# Patient Record
Sex: Male | Born: 1960 | Race: Black or African American | Hispanic: No | Marital: Married | Smoking: Former smoker
Health system: Southern US, Community
[De-identification: ages and names within clinical notes are randomized; demographics above are authoritative.]

## PROBLEM LIST (undated history)

## (undated) DIAGNOSIS — I214 Non-ST elevation (NSTEMI) myocardial infarction: Secondary | ICD-10-CM

## (undated) DIAGNOSIS — I10 Essential (primary) hypertension: Secondary | ICD-10-CM

## (undated) DIAGNOSIS — I2510 Atherosclerotic heart disease of native coronary artery without angina pectoris: Secondary | ICD-10-CM

## (undated) DIAGNOSIS — E785 Hyperlipidemia, unspecified: Secondary | ICD-10-CM

## (undated) HISTORY — PX: OTHER SURGICAL HISTORY: SHX169

## (undated) HISTORY — DX: Non-ST elevation (NSTEMI) myocardial infarction: I21.4

## (undated) HISTORY — DX: Atherosclerotic heart disease of native coronary artery without angina pectoris: I25.10

## (undated) HISTORY — DX: Essential (primary) hypertension: I10

---

## 2014-07-09 ENCOUNTER — Inpatient Hospital Stay (HOSPITAL_COMMUNITY)
Admission: AD | Admit: 2014-07-09 | Discharge: 2014-07-20 | DRG: 234 | Disposition: A | Discharge status: Home / Self Care | Payer: 59 | Source: Other Acute Inpatient Hospital | Attending: Thoracic Surgery (Cardiothoracic Vascular Surgery) | Admitting: Thoracic Surgery (Cardiothoracic Vascular Surgery)

## 2014-07-09 ENCOUNTER — Encounter (HOSPITAL_COMMUNITY): Payer: Self-pay | Admitting: Cardiology

## 2014-07-09 DIAGNOSIS — K567 Ileus, unspecified: Secondary | ICD-10-CM | POA: Diagnosis not present

## 2014-07-09 DIAGNOSIS — E785 Hyperlipidemia, unspecified: Secondary | ICD-10-CM | POA: Diagnosis present

## 2014-07-09 DIAGNOSIS — I10 Essential (primary) hypertension: Secondary | ICD-10-CM | POA: Diagnosis present

## 2014-07-09 DIAGNOSIS — Z951 Presence of aortocoronary bypass graft: Secondary | ICD-10-CM

## 2014-07-09 DIAGNOSIS — I4891 Unspecified atrial fibrillation: Secondary | ICD-10-CM | POA: Diagnosis not present

## 2014-07-09 DIAGNOSIS — R0602 Shortness of breath: Secondary | ICD-10-CM

## 2014-07-09 DIAGNOSIS — F17290 Nicotine dependence, other tobacco product, uncomplicated: Secondary | ICD-10-CM | POA: Diagnosis present

## 2014-07-09 DIAGNOSIS — I2510 Atherosclerotic heart disease of native coronary artery without angina pectoris: Secondary | ICD-10-CM

## 2014-07-09 DIAGNOSIS — I9789 Other postprocedural complications and disorders of the circulatory system, not elsewhere classified: Secondary | ICD-10-CM | POA: Diagnosis not present

## 2014-07-09 DIAGNOSIS — E8770 Fluid overload, unspecified: Secondary | ICD-10-CM | POA: Diagnosis not present

## 2014-07-09 DIAGNOSIS — Z8249 Family history of ischemic heart disease and other diseases of the circulatory system: Secondary | ICD-10-CM

## 2014-07-09 DIAGNOSIS — D72829 Elevated white blood cell count, unspecified: Secondary | ICD-10-CM | POA: Diagnosis not present

## 2014-07-09 DIAGNOSIS — R079 Chest pain, unspecified: Secondary | ICD-10-CM

## 2014-07-09 DIAGNOSIS — I25110 Atherosclerotic heart disease of native coronary artery with unstable angina pectoris: Secondary | ICD-10-CM | POA: Diagnosis present

## 2014-07-09 DIAGNOSIS — R072 Precordial pain: Secondary | ICD-10-CM | POA: Diagnosis present

## 2014-07-09 DIAGNOSIS — I249 Acute ischemic heart disease, unspecified: Secondary | ICD-10-CM | POA: Diagnosis present

## 2014-07-09 DIAGNOSIS — Z7982 Long term (current) use of aspirin: Secondary | ICD-10-CM

## 2014-07-09 DIAGNOSIS — I480 Paroxysmal atrial fibrillation: Secondary | ICD-10-CM | POA: Diagnosis not present

## 2014-07-09 DIAGNOSIS — I214 Non-ST elevation (NSTEMI) myocardial infarction: Principal | ICD-10-CM | POA: Diagnosis present

## 2014-07-09 DIAGNOSIS — Z79899 Other long term (current) drug therapy: Secondary | ICD-10-CM

## 2014-07-09 HISTORY — DX: Hyperlipidemia, unspecified: E78.5

## 2014-07-09 MED ORDER — ASPIRIN 300 MG RE SUPP
300.0000 mg | RECTAL | Status: AC
  Filled 2014-07-09: qty 1

## 2014-07-09 MED ORDER — ACETAMINOPHEN 325 MG PO TABS
650.0000 mg | ORAL_TABLET | ORAL | Status: DC | PRN
  Administered 2014-07-09 – 2014-07-11 (×3): 650 mg via ORAL
  Filled 2014-07-09 (×2): qty 2

## 2014-07-09 MED ORDER — METOPROLOL TARTRATE 25 MG PO TABS
25.0000 mg | ORAL_TABLET | Freq: Two times a day (BID) | ORAL | Status: DC
  Administered 2014-07-09 – 2014-07-13 (×10): 25 mg via ORAL
  Filled 2014-07-09 (×13): qty 1

## 2014-07-09 MED ORDER — SIMVASTATIN 20 MG PO TABS
20.0000 mg | ORAL_TABLET | Freq: Every day | ORAL | Status: DC
  Administered 2014-07-09 – 2014-07-19 (×10): 20 mg via ORAL
  Filled 2014-07-09 (×12): qty 1

## 2014-07-09 MED ORDER — LISINOPRIL 2.5 MG PO TABS
2.5000 mg | ORAL_TABLET | Freq: Every day | ORAL | Status: DC
  Administered 2014-07-09 – 2014-07-13 (×5): 2.5 mg via ORAL
  Filled 2014-07-09 (×6): qty 1

## 2014-07-09 MED ORDER — ASPIRIN 81 MG PO CHEW: 324.0000 mg | CHEWABLE_TABLET | ORAL | Status: AC

## 2014-07-09 MED ORDER — SODIUM CHLORIDE 0.9 % IJ SOLN: 3.0000 mL | INTRAMUSCULAR | Status: DC | PRN

## 2014-07-09 MED ORDER — NITROGLYCERIN 0.4 MG SL SUBL: 0.4000 mg | SUBLINGUAL_TABLET | SUBLINGUAL | Status: DC | PRN

## 2014-07-09 MED ORDER — ONDANSETRON HCL 4 MG/2ML IJ SOLN: 4.0000 mg | Freq: Four times a day (QID) | INTRAMUSCULAR | Status: DC | PRN

## 2014-07-09 MED ORDER — ASPIRIN EC 81 MG PO TBEC
81.0000 mg | DELAYED_RELEASE_TABLET | Freq: Every day | ORAL | Status: DC
  Administered 2014-07-11 – 2014-07-13 (×3): 81 mg via ORAL
  Filled 2014-07-09 (×5): qty 1

## 2014-07-09 MED ORDER — SODIUM CHLORIDE 0.9 % IJ SOLN
3.0000 mL | Freq: Two times a day (BID) | INTRAMUSCULAR | Status: DC
  Administered 2014-07-09 – 2014-07-10 (×2): 3 mL via INTRAVENOUS

## 2014-07-09 MED ORDER — SODIUM CHLORIDE 0.9 % IV SOLN
250.0000 mL | INTRAVENOUS | Status: DC | PRN
Start: 2014-07-09 — End: 2014-07-10

## 2014-07-09 MED ORDER — SODIUM CHLORIDE 0.9 % IV SOLN
INTRAVENOUS | Status: DC
  Administered 2014-07-09 – 2014-07-10 (×2): via INTRAVENOUS

## 2014-07-09 MED ORDER — HEPARIN (PORCINE) IN NACL 100-0.45 UNIT/ML-% IJ SOLN
1800.0000 [IU]/h | INTRAMUSCULAR | Status: DC
  Administered 2014-07-09 (×2): 1800 [IU]/h via INTRAVENOUS
  Filled 2014-07-09 (×3): qty 250

## 2014-07-09 MED ORDER — ASPIRIN 81 MG PO CHEW: 81.0000 mg | CHEWABLE_TABLET | ORAL | Status: DC

## 2014-07-09 MED ORDER — AMLODIPINE BESYLATE 10 MG PO TABS
10.0000 mg | ORAL_TABLET | Freq: Every day | ORAL | Status: DC
  Administered 2014-07-09 – 2014-07-13 (×5): 10 mg via ORAL
  Filled 2014-07-09 (×6): qty 1

## 2014-07-09 NOTE — H&P (Signed)
Physician History and Physical    Patient ID: Benjamin King MRN: [id number] DOB/AGE: [birth year] 53 y.o. Admit date: 07/09/2014  Primary Care Physician: Wende Crease MD Primary Cardiologist N/A  HPI: Benjamin King is seen in transfer from [facility] in [place] for evaluation of NSTEMI. He has a history of HTN and Hyperlipidemia-treated. Also has a family history of premature CAD with mother having CABG at age 45. He was in good health until Wednesday this week when he developed acute substernal chest pain and dyspnea while at work. It felt a heavy weight on his chest. Had to stop and rest because of pain. Pain eventually resolved and he did [facility] Thursday. On Friday he awoke with chest pain and dyspnea again and went to the ED. He was admitted. Ecg was normal. Troponin was elevated with peak 0.23. Pain resolved. He was placed on IV heparin. Now feels ok.  Reports a 50 lb weight loss this year with diet and exercise. Goes to the gym regularly for exercise. No prior cardiac history or evaluation.  Review of systems complete and found to be negative unless listed above  Past Medical History  Diagnosis Date  . HTN (hypertension)   . Hyperlipidemia     Family history positive for early CAD in mother.   History   Social History  . Marital Status: Married    Spouse Name: N/A  . Number of Children: N/A  . Years of Education: N/A   Occupational History  . truck driver/delivery    Social History Main Topics  . Smoking status: Light Tobacco Smoker    Types: Cigars  . Smokeless tobacco: Not on file  . Alcohol Use: Not on file  . Drug Use: Not on file  . Sexual Activity: Not on file   Other Topics Concern  . Not on file   Social History Narrative  . No narrative on file    Past Surgical History  Procedure Laterality Date  . Right knee surgery      trauma     Prescriptions prior to admission  Medication Sig Dispense Refill Last Dose  . amLODipine (NORVASC) 10 MG tablet Take 10  mg by mouth daily.   07/07/2014  . aspirin EC 81 MG tablet Take 81 mg by mouth daily.   07/07/2014  . atenolol (TENORMIN) 50 MG tablet Take 50 mg by mouth daily.   07/07/2014 at 0730  . hydrochlorothiazide (HYDRODIURIL) 25 MG tablet Take 25 mg by mouth daily.   07/07/2014  . lisinopril (PRINIVIL,ZESTRIL) 2.5 MG tablet Take 2.5 mg by mouth daily.   07/07/2014  . Multiple Vitamins-Minerals (MULTIVITAMIN ADULT PO) Take 1 tablet by mouth daily.   07/07/2014  . nicotine (NICODERM CQ - DOSED IN MG/24 HOURS) 14 mg/24hr patch Place 14 mg onto the skin daily.   07/07/2014  . simvastatin (ZOCOR) 20 MG tablet Take 20 mg by mouth daily.   07/07/2014    Physical Exam: Blood pressure 128/78, pulse 60, temperature 98.5 F (36.9 C), temperature source Oral, resp. rate 18, SpO2 99 %.  Current Weight  No data found for Wt    GENERAL:  Well appearing BM in NAD HEENT:  PERRL, EOMI, sclera are clear. Oropharynx is clear. NECK:  No jugular venous distention, carotid upstroke brisk and symmetric, no bruits, no thyromegaly or adenopathy LUNGS:  Clear to auscultation bilaterally CHEST:  Unremarkable HEART:  RRR,  PMI not displaced or sustained,S1 and S2 within normal limits, no S3, no S4: no clicks, no  rubs, no murmurs ABD:  Soft, nontender. BS +, no masses or bruits. No hepatomegaly, no splenomegaly EXT:  2 + pulses throughout, no edema, no cyanosis no clubbing SKIN:  Warm and dry.  No rashes NEURO:  Alert and oriented x 3. Cranial nerves II through XII intact. PSYCH:  Cognitively intact    Labs: Reviewed from outside hospital. CBC normal Troponin .09>.21>.23>.19>.14 Sodium 134, potassium 3.4. BUN 12, creatinine 0.69 Total CK 240 MB 1.6.  Cholesterol-133, triglycerides-194, HDL-33, LDL-61  Radiology: From outside hospital--NAD  EKG: from outside hospital Sinus brady, otherwise normal.  ASSESSMENT AND PLAN:  1. NSTEMI. Now pain free. Peak troponin 0.23 with normal Ecg. Risk factors of HTN, dyslipidemia,  and family history of CAD. Smokes cigars. Will admit. Continue IV heparin. Plan cardiac cath tomorrow.  2. HTN controlled. Continue outpatient meds. 3. Dyslipidemia. Continue statin therapy 4. Tobacco use- counsel on cessation.  5. History of obesity.  Signed: Peter SwazilandJordan, MDFACC  07/09/2014, 1:53 PM

## 2014-07-09 NOTE — Progress Notes (Signed)
ANTICOAGULATION CONSULT NOTE - Initial Consult  Pharmacy Consult for heparin Indication: chest pain/ACS  No Known Allergies  Patient Measurements:  Wt = 126kg Ht = 75in Heparin Dosing Weight: 111kg  Vital Signs: Temp: 98.5 F (36.9 C) (04/17 1152) Temp Source: Oral (04/17 1152) BP: 128/78 mmHg (04/17 1152) Pulse Rate: 60 (04/17 1152)  Labs: No results for input(s): HGB, HCT, PLT, APTT, LABPROT, INR, HEPARINUNFRC, CREATININE, CKTOTAL, CKMB, TROPONINI in the last 72 hours.  CrCl cannot be calculated (Unknown ideal weight.).   Medical History: Past Medical History  Diagnosis Date  . HTN (hypertension)   . Hyperlipidemia     Medications:  Prescriptions prior to admission  Medication Sig Dispense Refill Last Dose  . amLODipine (NORVASC) 10 MG tablet Take 10 mg by mouth daily.   07/07/2014  . aspirin EC 81 MG tablet Take 81 mg by mouth daily.   07/07/2014  . atenolol (TENORMIN) 50 MG tablet Take 50 mg by mouth daily.   07/07/2014 at 0730  . hydrochlorothiazide (HYDRODIURIL) 25 MG tablet Take 25 mg by mouth daily.   07/07/2014  . lisinopril (PRINIVIL,ZESTRIL) 2.5 MG tablet Take 2.5 mg by mouth daily.   07/07/2014  . Multiple Vitamins-Minerals (MULTIVITAMIN ADULT PO) Take 1 tablet by mouth daily.   07/07/2014  . nicotine (NICODERM CQ - DOSED IN MG/24 HOURS) 14 mg/24hr patch Place 14 mg onto the skin daily.   07/07/2014  . simvastatin (ZOCOR) 20 MG tablet Take 20 mg by mouth daily.   07/07/2014   Scheduled:  . amLODipine  10 mg Oral Daily  . aspirin  324 mg Oral NOW   Or  . aspirin  300 mg Rectal NOW  . [START ON 07/10/2014] aspirin EC  81 mg Oral Daily  . lisinopril  2.5 mg Oral Daily  . metoprolol tartrate  25 mg Oral BID  . simvastatin  20 mg Oral q1800    Assessment: 54 yo with a hx HTN and HLD who [facility]. IV heparin was started there at 1760 units/hr and it has been therapeutic x3. The plan is for cath in AM.  We are going to adjust the rate slightly  here.  Goal of Therapy:  Heparin level 0.3-0.7 units/ml Monitor platelets by anticoagulation protocol: Yes   Plan:   Increase heparin to 1800 units/hr Daily heparin level and CBC  Ulyses SouthwardMinh Jamichael Knotts, PharmD Pager: [phone] 07/09/2014 2:52 PM

## 2014-07-10 ENCOUNTER — Encounter (HOSPITAL_COMMUNITY): Payer: Self-pay | Admitting: Interventional Cardiology

## 2014-07-10 ENCOUNTER — Encounter (HOSPITAL_COMMUNITY)
Admission: AD | Disposition: A | Discharge status: Home / Self Care | Payer: Self-pay | Source: Other Acute Inpatient Hospital | Attending: Thoracic Surgery (Cardiothoracic Vascular Surgery)

## 2014-07-10 ENCOUNTER — Other Ambulatory Visit: Payer: Self-pay | Admitting: *Deleted

## 2014-07-10 DIAGNOSIS — I10 Essential (primary) hypertension: Secondary | ICD-10-CM

## 2014-07-10 DIAGNOSIS — I2510 Atherosclerotic heart disease of native coronary artery without angina pectoris: Secondary | ICD-10-CM

## 2014-07-10 DIAGNOSIS — E785 Hyperlipidemia, unspecified: Secondary | ICD-10-CM

## 2014-07-10 HISTORY — PX: LEFT HEART CATHETERIZATION WITH CORONARY ANGIOGRAM: SHX5451

## 2014-07-10 LAB — CBC
HCT: 37.2 % — ABNORMAL LOW (ref 39.0–52.0)
Hemoglobin: 12.3 g/dL — ABNORMAL LOW (ref 13.0–17.0)
MCH: 29.9 pg (ref 26.0–34.0)
MCHC: 33.1 g/dL (ref 30.0–36.0)
MCV: 90.5 fL (ref 78.0–100.0)
PLATELETS: 236 10*3/uL (ref 150–400)
RBC: 4.11 MIL/uL — AB (ref 4.22–5.81)
RDW: 13.2 % (ref 11.5–15.5)
WBC: 9.4 10*3/uL (ref 4.0–10.5)

## 2014-07-10 LAB — BASIC METABOLIC PANEL
ANION GAP: 10 (ref 5–15)
CALCIUM: 8.6 mg/dL (ref 8.4–10.5)
CHLORIDE: 104 mmol/L (ref 96–112)
CO2: 26 mmol/L (ref 19–32)
CREATININE: 0.72 mg/dL (ref 0.50–1.35)
GFR calc non Af Amer: >90 mL/min (ref >90)
Glucose, Bld: 106 mg/dL — ABNORMAL HIGH (ref 70–99)
Potassium: 3.6 mmol/L (ref 3.5–5.1)
Sodium: 140 mmol/L (ref 135–145)

## 2014-07-10 LAB — LIPID PANEL
CHOL/HDL RATIO: 4.5 ratio
Cholesterol: 117 mg/dL (ref 0–200)
HDL: 26 mg/dL — AB (ref >39)
LDL Cholesterol: 53 mg/dL (ref 0–99)
TRIGLYCERIDES: 190 mg/dL — AB (ref <150)
VLDL: 38 mg/dL (ref 0–40)

## 2014-07-10 LAB — HEPARIN LEVEL (UNFRACTIONATED): Heparin Unfractionated: 0.48 IU/mL (ref 0.30–0.70)

## 2014-07-10 LAB — PROTIME-INR
INR: 1.09 (ref 0.00–1.49)
PROTHROMBIN TIME: 14.2 s (ref 11.6–15.2)

## 2014-07-10 SURGERY — LEFT HEART CATHETERIZATION WITH CORONARY ANGIOGRAM
Anesthesia: LOCAL

## 2014-07-10 MED ORDER — SODIUM CHLORIDE 0.9 % IV SOLN: INTRAVENOUS | Status: AC

## 2014-07-10 MED ORDER — FENTANYL CITRATE (PF) 100 MCG/2ML IJ SOLN
INTRAMUSCULAR | Status: AC
  Filled 2014-07-10: qty 2

## 2014-07-10 MED ORDER — HEPARIN (PORCINE) IN NACL 100-0.45 UNIT/ML-% IJ SOLN
1900.0000 [IU]/h | INTRAMUSCULAR | Status: DC
  Administered 2014-07-11 (×2): 1800 [IU]/h via INTRAVENOUS
  Administered 2014-07-12: 1900 [IU]/h via INTRAVENOUS
  Administered 2014-07-12: 1800 [IU]/h via INTRAVENOUS
  Filled 2014-07-10 (×7): qty 250

## 2014-07-10 MED ORDER — LIDOCAINE HCL (PF) 1 % IJ SOLN
INTRAMUSCULAR | Status: AC
  Filled 2014-07-10: qty 30

## 2014-07-10 MED ORDER — ASPIRIN 81 MG PO CHEW
CHEWABLE_TABLET | ORAL | Status: AC
  Administered 2014-07-10: 81 mg
  Filled 2014-07-10: qty 1

## 2014-07-10 MED ORDER — NITROGLYCERIN 1 MG/10 ML FOR IR/CATH LAB
INTRA_ARTERIAL | Status: AC
Start: 2014-07-10 — End: 2014-07-10
  Filled 2014-07-10: qty 10

## 2014-07-10 MED ORDER — MIDAZOLAM HCL 2 MG/2ML IJ SOLN
INTRAMUSCULAR | Status: AC
  Filled 2014-07-10: qty 2

## 2014-07-10 MED ORDER — HEPARIN (PORCINE) IN NACL 2-0.9 UNIT/ML-% IJ SOLN
INTRAMUSCULAR | Status: AC
  Filled 2014-07-10: qty 1000

## 2014-07-10 MED ORDER — VERAPAMIL HCL 2.5 MG/ML IV SOLN
INTRAVENOUS | Status: AC
  Filled 2014-07-10: qty 2

## 2014-07-10 MED ORDER — HEPARIN SODIUM (PORCINE) 1000 UNIT/ML IJ SOLN
INTRAMUSCULAR | Status: AC
  Filled 2014-07-10: qty 1

## 2014-07-10 MED FILL — Heparin Sodium (Porcine) 100 Unt/ML in Sodium Chloride 0.45%: INTRAMUSCULAR | Qty: 250 | Status: AC

## 2014-07-10 NOTE — Interval H&P Note (Signed)
History and Physical Interval Note:  07/10/2014 3:01 PM  Benjamin King  has presented today for cardiac cath with the diagnosis of NSTEMI.  The various methods of treatment have been discussed with the patient and family. After consideration of risks, benefits and other options for treatment, the patient has consented to  Procedure(s): LEFT HEART CATHETERIZATION WITH CORONARY ANGIOGRAM (N/A) as a surgical intervention .  The patient's history has been reviewed, patient examined, no change in status, stable for surgery.  I have reviewed the patient's chart and labs.  Questions were answered to the patient's satisfaction.  \  Cath Lab Visit (complete for each Cath Lab visit)  Clinical Evaluation Leading to the Procedure:   ACS: Yes.    Non-ACS:    Anginal Classification: CCS III  Anti-ischemic medical therapy: Maximal Therapy (2 or more classes of medications)  Non-Invasive Test Results: No non-invasive testing performed  Prior CABG: No previous CABG         MCALHANY,CHRISTOPHER

## 2014-07-10 NOTE — Progress Notes (Signed)
ANTICOAGULATION CONSULT NOTE - Follow Up Consult  Pharmacy Consult for Heparin Indication: NSTEMI  No Known Allergies  Patient Measurements: Height: 6\' 3"  (190.5 cm) Weight: 277 lb (125.646 kg) IBW/kg (Calculated) : 84.5 Heparin Dosing Weight:   Vital Signs: Temp: 98.9 F (37.2 C) (04/18 1952) Temp Source: Oral (04/18 1952) BP: 139/82 mmHg (04/18 1952) Pulse Rate: 78 (04/18 1952)  Labs:  Recent Labs  07/10/14 0322  HGB 12.3*  HCT 37.2*  PLT 236  LABPROT 14.2  INR 1.09  HEPARINUNFRC 0.48  CREATININE 0.72    Estimated Creatinine Clearance: 154.2 mL/min (by C-G formula based on Cr of 0.72).   Medications:  Scheduled:  . amLODipine  10 mg Oral Daily  . aspirin EC  81 mg Oral Daily  . lisinopril  2.5 mg Oral Daily  . metoprolol tartrate  25 mg Oral BID  . simvastatin  20 mg Oral q1800    Assessment: 54yo male with NSTEMI. Cath=3VCAD. TR band removed 1838. To resume heparin in 8 hrs.  Goal of Therapy:  Heparin level 0.3-0.7 units/ml Monitor platelets by anticoagulation protocol: Yes   Plan:  Consult CVTS Resume heparin 0240 at previous 1800 units/hr Will check heparin level 8 hrs after heparin resumed. Daily heparin level and CBC

## 2014-07-10 NOTE — Progress Notes (Signed)
Subjective: No CP  No SOB  Nervous   Objective: Filed Vitals:   07/09/14 1152 07/09/14 1637 07/09/14 2220 07/10/14 0528  BP: 128/78  144/79 148/80  Pulse: 60  79 78  Temp: 98.5 F (36.9 C)  98.6 F (37 C) 98.6 F (37 C)  TempSrc: Oral  Oral Oral  Resp: 18  18 18   Height:  6\' 3"  (1.905 m)    Weight:  277 lb (125.646 kg)    SpO2: 99%  100% 98%   Weight change:   Intake/Output Summary (Last 24 hours) at 07/10/14 0950 Last data filed at 07/10/14 0650  Gross per 24 hour  Intake   1015 ml  Output      0 ml  Net   1015 ml    General: Alert, awake, oriented x3, in no acute distress Neck:  JVP is normal Heart: Regular rate and rhythm, without murmurs, rubs, gallops.  Lungs: Clear to auscultation.  No rales or wheezes. Exemities:  No edema.   Neuro: Grossly intact, nonfocal.  Tele:  SR 60s    Lab Results: Results for orders placed or performed during the hospital encounter of 07/09/14 (from the past 24 hour(s))  CBC     Status: Abnormal   Collection Time: 07/10/14  3:22 AM  Result Value Ref Range   WBC 9.4 4.0 - 10.5 K/uL   RBC 4.11 (L) 4.22 - 5.81 MIL/uL   Hemoglobin 12.3 (L) 13.0 - 17.0 g/dL   HCT 82.937.2 (L) 56.239.0 - 13.052.0 %   MCV 90.5 78.0 - 100.0 fL   MCH 29.9 26.0 - 34.0 pg   MCHC 33.1 30.0 - 36.0 g/dL   RDW 86.513.2 78.411.5 - 69.615.5 %   Platelets 236 150 - 400 K/uL  Basic metabolic panel     Status: Abnormal   Collection Time: 07/10/14  3:22 AM  Result Value Ref Range   Sodium 140 135 - 145 mmol/L   Potassium 3.6 3.5 - 5.1 mmol/L   Chloride 104 96 - 112 mmol/L   CO2 26 19 - 32 mmol/L   Glucose, Bld 106 (H) 70 - 99 mg/dL   BUN <5 (L) 6 - 23 mg/dL   Creatinine, Ser 2.950.72 0.50 - 1.35 mg/dL   Calcium 8.6 8.4 - 28.410.5 mg/dL   GFR calc non Af Amer >90 >90 mL/min   GFR calc Af Amer >90 >90 mL/min   Anion gap 10 5 - 15  Heparin level (unfractionated)     Status: None   Collection Time: 07/10/14  3:22 AM  Result Value Ref Range   Heparin Unfractionated 0.48 0.30 - 0.70 IU/mL    Protime-INR     Status: None   Collection Time: 07/10/14  3:22 AM  Result Value Ref Range   Prothrombin Time 14.2 11.6 - 15.2 seconds   INR 1.09 0.00 - 1.49  Lipid panel     Status: Abnormal   Collection Time: 07/10/14  3:22 AM  Result Value Ref Range   Cholesterol 117 0 - 200 mg/dL   Triglycerides 132190 (H) <150 mg/dL   HDL 26 (L) >44>39 mg/dL   Total CHOL/HDL Ratio 4.5 RATIO   VLDL 38 0 - 40 mg/dL   LDL Cholesterol 53 0 - 99 mg/dL    Studies/Results: No results found.  Medications: REviewed     @PROBHOSP @  1  NSTEMI   Transferre from [facility].  Trop 0.23   NO CP at present    Plan for cath today  2.  HTN  Fair control  Follow for now.    3.  HL  Keep on statin  4.  Tob  Counselled    LOS: 1 day   Dietrich Pates 07/10/2014, 9:50 AM \

## 2014-07-10 NOTE — Progress Notes (Signed)
ANTICOAGULATION CONSULT NOTE - Follow Up Consult  Pharmacy Consult for Heparin Indication: NSTEMI  No Known Allergies  Patient Measurements: Height: 6\' 3"  (190.5 cm) Weight: 277 lb (125.646 kg) IBW/kg (Calculated) : 84.5 Heparin Dosing Weight:   Vital Signs: Temp: 98.9 F (37.2 C) (04/18 0950) Temp Source: Oral (04/18 0950) BP: 125/86 mmHg (04/18 0950) Pulse Rate: 78 (04/18 0950)  Labs:  Recent Labs  07/10/14 0322  HGB 12.3*  HCT 37.2*  PLT 236  LABPROT 14.2  INR 1.09  HEPARINUNFRC 0.48  CREATININE 0.72    Estimated Creatinine Clearance: 154.2 mL/min (by C-G formula based on Cr of 0.72).   Medications:  Scheduled:  . amLODipine  10 mg Oral Daily  . aspirin  324 mg Oral NOW   Or  . aspirin  300 mg Rectal NOW  . aspirin  81 mg Oral Pre-Cath  . aspirin EC  81 mg Oral Daily  . lisinopril  2.5 mg Oral Daily  . metoprolol tartrate  25 mg Oral BID  . simvastatin  20 mg Oral q1800  . sodium chloride  3 mL Intravenous Q12H    Assessment: 54yo male with NSTEMI, for cath today.  Heparin level is therapeutic on 1800 units/hr.  Hg is 12.3 and pltc wnl.  No bleeding noted per d/w RN.  Goal of Therapy:  Heparin level 0.3-0.7 units/ml Monitor platelets by anticoagulation protocol: Yes   Plan:  Continue heparin at current rate F/U after cath  Marisue HumbleKendra Megahn Killings, PharmD Clinical Pharmacist [facility]

## 2014-07-10 NOTE — Progress Notes (Signed)
Pt transported off to Cath lab for procedure. Pt heparin drip stopped per protocol. Arabella MerlesP. Amo Effa Yarrow RN.

## 2014-07-10 NOTE — Progress Notes (Signed)
Pt TR band removed per protocol; VSS; cath site remains level 0, no bruising, hematoma or active bleeding. Sterile dsg applied to hand clean, dry and intact. Pt voices understanding of keeping right hand with limited movement for the next 24hrs. Pt in bed comfortably with call light within reach and spouse at side. Will continue to monitor quietly per protocol. Arabella MerlesP. Amo Serra Younan RN.

## 2014-07-10 NOTE — Progress Notes (Signed)
Pt vss post TR band removal; cath site level 0, dsg clean, dry and intact. Reported off to incoming RN. Arabella MerlesP. Amo Wylma Tatem RN.

## 2014-07-10 NOTE — CV Procedure (Signed)
Cardiac Catheterization Operative Report  Benjamin King [id number] 4/18/20163:03 PM No PCP Per Patient  Procedure Performed:  1. Left Heart Catheterization 2. Selective Coronary Angiography 3. Left ventricular angiogram  Operator: Verne Carrowhristopher McAlhany, MD  Arterial access site:  Right radial artery.   Indication: 54 yo male with history of HTN, HLD, tobacco abuse admitted [facility] with chest pain and troponin elevated to 0.2. [facility].                                     Procedure Details: The risks, benefits, complications, treatment options, and expected outcomes were discussed with the patient. The patient and/or family concurred with the proposed plan, giving informed consent. The patient was brought to the cath lab after IV hydration was begun and oral premedication was given. The patient was further sedated with Versed and Fentanyl. The right wrist was assessed with a modified Allens test which was positive. The right wrist was prepped and draped in a sterile fashion. 1% lidocaine was used for local anesthesia. Using the modified Seldinger access technique, a 5/6 French sheath was placed in the right radial artery. 3 mg Verapamil was given through the sheath. 5 is units IV heparin was given. Standard diagnostic catheters were used to perform selective coronary angiography. A pigtail catheter was used to perform a left ventricular angiogram. The sheath was removed from the right radial artery and a Terumo hemostasis band was applied at the arteriotomy site on the right wrist.   There were no immediate complications. The patient was taken to the recovery area in stable condition.   Hemodynamic Findings: Central aortic pressure: 146/86 Left ventricular pressure: 141/6/13  Angiographic Findings:  Left main: No obstructive disease.   Left Anterior Descending Artery: Large caliber vessel that courses to the apex. The proximal  vessel has mild plaque. The mid vessel is calcified and has diffuse 60-70% stenosis with hazy appearance in several views. The distal vessel is relatively disease free until it approaches the apex. The apical LAD becomes small in caliber and has diffuse 90% stenosis. There is a large Diagonal branch that arises early from the proximal LAD and functions as an intermediate branch. This Diagonal/intermediated branch has a focal mid 99% stenosis followed by segment of 50% stenosis. There is a small caliber diagonal branch that arises from the mid LAD.   Circumflex Artery: Moderate caliber vessel with moderate caliber obtuse marginal branch. The obtuse marginal branch has mid serial 99% stenosis. The AV groove Circumflex becomes smaller in caliber and has diffuse 20% stenosis.   Right Coronary Artery: Large dominant vessel. The proximal and mid vessel has diffuse 20% stenosis. The distal vessel has a hazy 80% stenosis . The moderate caliber posterolateral branch has proximal 95% stenosis. The moderate caliber PDA has proximal 40% stenosis. The distal PDA becomes small in caliber and has diffuse 60% stenosis.   Left Ventricular Angiogram: LVEF=60-65%.   Impression: 1. Triple vessel CAD 2. NSTEMI 3. Normal LV systolic function  Recommendations: He has diffuse CAD in all major vessels. Revascularization with PCI approach would require stenting in the distal RCA, right PLA, OM, Intermediate branch and likely the mid LAD. I will ask CT surgery to evaluate for CABG.        Complications:  None. The patient tolerated the procedure well.

## 2014-07-10 NOTE — H&P (View-Only) (Signed)
 Subjective: No CP  No SOB  Nervous   Objective: Filed Vitals:   07/09/14 1152 07/09/14 1637 07/09/14 2220 07/10/14 0528  BP: 128/78  144/79 148/80  Pulse: 60  79 78  Temp: 98.5 F (36.9 C)  98.6 F (37 C) 98.6 F (37 C)  TempSrc: Oral  Oral Oral  Resp: 18  18 18  Height:  6' 3" (1.905 m)    Weight:  277 lb (125.646 kg)    SpO2: 99%  100% 98%   Weight change:   Intake/Output Summary (Last 24 hours) at 07/10/14 0950 Last data filed at 07/10/14 0650  Gross per 24 hour  Intake   1015 ml  Output      0 ml  Net   1015 ml    General: Alert, awake, oriented x3, in no acute distress Neck:  JVP is normal Heart: Regular rate and rhythm, without murmurs, rubs, gallops.  Lungs: Clear to auscultation.  No rales or wheezes. Exemities:  No edema.   Neuro: Grossly intact, nonfocal.  Tele:  SR 60s    Lab Results: Results for orders placed or performed during the hospital encounter of 07/09/14 (from the past 24 hour(s))  CBC     Status: Abnormal   Collection Time: 07/10/14  3:22 AM  Result Value Ref Range   WBC 9.4 4.0 - 10.5 K/uL   RBC 4.11 (L) 4.22 - 5.81 MIL/uL   Hemoglobin 12.3 (L) 13.0 - 17.0 g/dL   HCT 37.2 (L) 39.0 - 52.0 %   MCV 90.5 78.0 - 100.0 fL   MCH 29.9 26.0 - 34.0 pg   MCHC 33.1 30.0 - 36.0 g/dL   RDW 13.2 11.5 - 15.5 %   Platelets 236 150 - 400 K/uL  Basic metabolic panel     Status: Abnormal   Collection Time: 07/10/14  3:22 AM  Result Value Ref Range   Sodium 140 135 - 145 mmol/L   Potassium 3.6 3.5 - 5.1 mmol/L   Chloride 104 96 - 112 mmol/L   CO2 26 19 - 32 mmol/L   Glucose, Bld 106 (H) 70 - 99 mg/dL   BUN <5 (L) 6 - 23 mg/dL   Creatinine, Ser 0.72 0.50 - 1.35 mg/dL   Calcium 8.6 8.4 - 10.5 mg/dL   GFR calc non Af Amer >90 >90 mL/min   GFR calc Af Amer >90 >90 mL/min   Anion gap 10 5 - 15  Heparin level (unfractionated)     Status: None   Collection Time: 07/10/14  3:22 AM  Result Value Ref Range   Heparin Unfractionated 0.48 0.30 - 0.70 IU/mL    Protime-INR     Status: None   Collection Time: 07/10/14  3:22 AM  Result Value Ref Range   Prothrombin Time 14.2 11.6 - 15.2 seconds   INR 1.09 0.00 - 1.49  Lipid panel     Status: Abnormal   Collection Time: 07/10/14  3:22 AM  Result Value Ref Range   Cholesterol 117 0 - 200 mg/dL   Triglycerides 190 (H) <150 mg/dL   HDL 26 (L) >39 mg/dL   Total CHOL/HDL Ratio 4.5 RATIO   VLDL 38 0 - 40 mg/dL   LDL Cholesterol 53 0 - 99 mg/dL    Studies/Results: No results found.  Medications: REviewed     @PROBHOSP@  1  NSTEMI   Transferre from [facility].  Trop 0.23   NO CP at present    Plan for cath today     2.  HTN  Fair control  Follow for now.    3.  HL  Keep on statin  4.  Tob  Counselled    LOS: 1 day   Benjamin King 07/10/2014, 9:50 AM \ 

## 2014-07-10 NOTE — Progress Notes (Signed)
Pt arrived back to room from cath lab; VSS, telemetry reapplied and verified; TR band to right radial with 11cc air per report; cath site level 0, no bruising, hematoma or bleeding noted. Pt educated on keeping right hand elevated on pillow, not twisting or bending or right hand. Pt voices understanding and denies any questions. Pt in bed with wife at bedside. Will continue to monitor per protocol. Arabella MerlesP. Amo Skyy Mcknight RN.

## 2014-07-10 NOTE — Progress Notes (Signed)
UR Completed.  [phone]  

## 2014-07-10 NOTE — Progress Notes (Signed)
Patient Name: Benjamin King Date of Encounter: 07/10/2014  Principal Problem:   NSTEMI (non-ST elevated myocardial infarction) Active Problems:   ACS (acute coronary syndrome)   HTN (hypertension)   Hyperlipidemia    Primary Cardiologist: N/A (new)  Patient Profile: 54 yo male w/ hx of HTN, HLD admitted as transfer 04/17 for evaluation of NSTEMI ([facility], shortness of breath).  SUBJECTIVE: Denies chest pain, palpitations, shortness of breath. States he is nervous about his cardiac cath today. Reports he continues to have right sided arm pain, occasional pain in his RUQ.  OBJECTIVE Filed Vitals:   07/09/14 1152 07/09/14 1637 07/09/14 2220 07/10/14 0528  BP: 128/78  144/79 148/80  Pulse: 60  79 78  Temp: 98.5 F (36.9 C)  98.6 F (37 C) 98.6 F (37 C)  TempSrc: Oral  Oral Oral  Resp: 18  18 18   Height:  6\' 3"  (1.905 m)    Weight:  277 lb (125.646 kg)    SpO2: 99%  100% 98%    Intake/Output Summary (Last 24 hours) at 07/10/14 [id number] Last data filed at 07/10/14 0650  Gross per 24 hour  Intake   1015 ml  Output      0 ml  Net   1015 ml   Filed Weights   07/09/14 1637  Weight: 277 lb (125.646 kg)    PHYSICAL EXAM General: Well developed, well nourished, male in no acute distress. Head: Normocephalic, atraumatic.  Neck: Supple without bruits, JVD not elevated. Lungs:  Resp regular and unlabored, CTA. Heart: RRR, S1, S2, no S3, S4, or murmur; no rub. Abdomen: Soft, non-tender, non-distended, BS + x 4.  Extremities: No clubbing, cyanosis, edema.  Neuro: Alert and oriented X 3. Moves all extremities spontaneously. Psych: Normal affect.  LABS: CBC: Recent Labs  07/10/14 0322  WBC 9.4  HGB 12.3*  HCT 37.2*  MCV 90.5  PLT 236   INR: Recent Labs  07/10/14 0322  INR 1.09   Basic Metabolic Panel: Recent Labs  07/10/14 0322  NA 140  K 3.6  CL 104  CO2 26  GLUCOSE 106*  BUN <5*  CREATININE 0.72  CALCIUM  8.6   Fasting Lipid Panel: Recent Labs  07/10/14 0322  CHOL 117  HDL 26*  LDLCALC 53  TRIG 213190*  CHOLHDL 4.5   TELE: SR, 70-75       ECG: SR, 74   Current Medications:  . amLODipine  10 mg Oral Daily  . aspirin  324 mg Oral NOW   Or  . aspirin  300 mg Rectal NOW  . aspirin  81 mg Oral Pre-Cath  . aspirin EC  81 mg Oral Daily  . lisinopril  2.5 mg Oral Daily  . metoprolol tartrate  25 mg Oral BID  . simvastatin  20 mg Oral q1800  . sodium chloride  3 mL Intravenous Q12H   . sodium chloride 75 mL/hr at 07/10/14 0650  . heparin 1,800 Units/hr (07/10/14 0650)    ASSESSMENT AND PLAN:  Principal Problem:   NSTEMI (non-ST elevated myocardial infarction) - Currently CP free on IV Heparin - troponin 0.09, 0.21, 0.23, 0.19, 0.14 (per outside hospital record), ECG no acute ischemic changes - continue heparin drip, ASA, statin, BB, ACEi - cardiac cath today, follow-up results  Active Problems:   ACS (acute coronary syndrome) - see above    HTN (hypertension) - systolic BP 128-148 over past 24 hours - continue amlodipine, lisinopril  Hyperlipidemia - continue simvastatin  BRITTAINY SIMMONS, PA-C 07/10/2014 Patient seen  Already in cath lab   WIll follow.  Dietrich Pates

## 2014-07-11 ENCOUNTER — Other Ambulatory Visit: Payer: Self-pay | Admitting: *Deleted

## 2014-07-11 ENCOUNTER — Inpatient Hospital Stay (HOSPITAL_COMMUNITY): Payer: 59

## 2014-07-11 DIAGNOSIS — I2510 Atherosclerotic heart disease of native coronary artery without angina pectoris: Secondary | ICD-10-CM

## 2014-07-11 DIAGNOSIS — I25110 Atherosclerotic heart disease of native coronary artery with unstable angina pectoris: Secondary | ICD-10-CM

## 2014-07-11 LAB — PULMONARY FUNCTION TEST
DL/VA % pred: 105 %
DL/VA: 5.16 ml/min/mmHg/L
DLCO COR % PRED: 101 %
DLCO cor: 39.84 ml/min/mmHg
DLCO unc % pred: 96 %
DLCO unc: 37.66 ml/min/mmHg
FEF 25-75 POST: 3.92 L/s
FEF 25-75 PRE: 3.88 L/s
FEF2575-%CHANGE-POST: 1 %
FEF2575-%PRED-POST: 105 %
FEF2575-%PRED-PRE: 104 %
FEV1-%Change-Post: 0 %
FEV1-%Pred-Post: 112 %
FEV1-%Pred-Pre: 111 %
FEV1-PRE: 4.35 L
FEV1-Post: 4.39 L
FEV1FVC-%Change-Post: 1 %
FEV1FVC-%Pred-Pre: 96 %
FEV6-%CHANGE-POST: 2 %
FEV6-%PRED-POST: 115 %
FEV6-%Pred-Pre: 112 %
FEV6-POST: 5.52 L
FEV6-Pre: 5.41 L
FEV6FVC-%Change-Post: 1 %
FEV6FVC-%Pred-Post: 101 %
FEV6FVC-%Pred-Pre: 99 %
FVC-%Change-Post: 0 %
FVC-%PRED-POST: 114 %
FVC-%PRED-PRE: 114 %
FVC-POST: 5.62 L
FVC-Pre: 5.65 L
Post FEV1/FVC ratio: 78 %
Post FEV6/FVC ratio: 98 %
Pre FEV1/FVC ratio: 77 %
Pre FEV6/FVC Ratio: 97 %
RV % pred: 113 %
RV: 2.67 L
TLC % pred: 104 %
TLC: 8.34 L

## 2014-07-11 LAB — CBC
HCT: 37.7 % — ABNORMAL LOW (ref 39.0–52.0)
Hemoglobin: 12.8 g/dL — ABNORMAL LOW (ref 13.0–17.0)
MCH: 30.5 pg (ref 26.0–34.0)
MCHC: 34 g/dL (ref 30.0–36.0)
MCV: 90 fL (ref 78.0–100.0)
PLATELETS: 252 10*3/uL (ref 150–400)
RBC: 4.19 MIL/uL — AB (ref 4.22–5.81)
RDW: 13.3 % (ref 11.5–15.5)
WBC: 8.4 10*3/uL (ref 4.0–10.5)

## 2014-07-11 LAB — HEPARIN LEVEL (UNFRACTIONATED): HEPARIN UNFRACTIONATED: 0.45 [IU]/mL (ref 0.30–0.70)

## 2014-07-11 MED ORDER — ALBUTEROL SULFATE (2.5 MG/3ML) 0.083% IN NEBU
2.5000 mg | INHALATION_SOLUTION | Freq: Once | RESPIRATORY_TRACT | Status: AC
  Administered 2014-07-11: 2.5 mg via RESPIRATORY_TRACT

## 2014-07-11 NOTE — Progress Notes (Signed)
CARDIAC REHAB PHASE I   PRE:  Rate/Rhythm: 70 SR  BP:  Supine:   Sitting: 130/90  Standing:    SaO2: 98%RA  MODE:  Ambulation: 500 ft   POST:  Rate/Rhythm: 76 SR  BP:  Supine:   Sitting: 130/90  Standing:    SaO2: 100%RA 1055-1140 Pt walked 500 ft with steady gait. No CP but some SOB after walk. Gave [facility] booklet and care guide in preparation if pt to have surgery. Discussed importance of sternal precautions after surgery, importance of IS and walking after surgery. Wrote down how they can view pre op video. Pt stated he will be staying with mother-in-law when discharged to assist with his care.   [name] Clarie Camey, RN BSN  07/11/2014 11:36 AM

## 2014-07-11 NOTE — Progress Notes (Signed)
   Subjective: Deneis CP  NO SOB   Objective: Filed Vitals:   07/10/14 1952 07/10/14 2138 07/11/14 0459 07/11/14 0952  BP: 139/82 134/58 115/61 122/68  Pulse: 78 84 80 93  Temp: 98.9 F (37.2 C) 99.8 F (37.7 C) 99.2 F (37.3 C)   TempSrc: Oral Oral Oral   Resp: 16 18 18    Height:      Weight:      SpO2: 99% 100% 99%    Weight change:   Intake/Output Summary (Last 24 hours) at 07/11/14 1015 Last data filed at 07/10/14 2322  Gross per 24 hour  Intake    250 ml  Output      0 ml  Net    250 ml    General: Alert, awake, oriented x3, in no acute distress Neck:  JVP is normal Heart: Regular rate and rhythm, without murmurs, rubs, gallops.  Lungs: Clear to auscultation.  No rales or wheezes. Exemities:  No edema.   Neuro: Grossly intact, nonfocal.  Tele:  SR   Lab Results: Results for orders placed or performed during the hospital encounter of 07/09/14 (from the past 24 hour(s))  CBC     Status: Abnormal   Collection Time: 07/11/14  5:41 AM  Result Value Ref Range   WBC 8.4 4.0 - 10.5 K/uL   RBC 4.19 (L) 4.22 - 5.81 MIL/uL   Hemoglobin 12.8 (L) 13.0 - 17.0 g/dL   HCT 16.137.7 (L) 09.639.0 - 04.552.0 %   MCV 90.0 78.0 - 100.0 fL   MCH 30.5 26.0 - 34.0 pg   MCHC 34.0 30.0 - 36.0 g/dL   RDW 40.913.3 81.111.5 - 91.415.5 %   Platelets 252 150 - 400 K/uL    Studies/Results: No results found.  Medications: Reviewed     @PROBHOSP @  1  CAD  Patient cath shows 3 V CAD  Surgery to see patient    2  HTN  Good control  3.  HL  Keep on statin  4.  Tob  Will need t oquit    LOS: 2 days   Dietrich Patesaula Ross 07/11/2014, 10:15 AM

## 2014-07-11 NOTE — Consult Note (Signed)
Reason for Consult:3 vessel CAD Referring Physician: Dr. Jordan  Benjamin King is an 53 y.o. male.  HPI: 53 yo man presented with a cc/o chest pain.  Benjamin King is a 53 yo man with no prior history of CAD. He does have multiple CRF including hypertension, hyperlipidemia, light tobacco abuse and a strong family history of premature CAD. He was in his usual state of health until about a week ago. He was making a delivery when he experienced severe fatigue and shortness of breath. He felt a pressure sensation in his chest. He rested a few minutes and it went away.  Then early Friday morning he was awakened from sleep with severe CP, "like someone was sitting on my chest," and shortness of breath. The pain eased, but when he got up it came back even worse. He went to [facility]. His ECG was unremarkable but his troponin was positive at 0.23. He was started on heparin. He has been pain free since then. 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   Past Medical History  Diagnosis Date  . HTN (hypertension)   . Hyperlipidemia     Past Surgical History  Procedure Laterality Date  . Right knee surgery      trauma  . Left heart catheterization with coronary angiogram N/A 07/10/2014    Procedure: LEFT HEART CATHETERIZATION WITH CORONARY ANGIOGRAM;  Surgeon: Jayadeep S Varanasi, MD;  Location: [facility] CATH [facility];  Service: Cardiovascular;  Laterality: N/A;    FAMILY HISTORY- + premature CAD- Mother had CABG  Social History:  reports that he has been smoking Cigars.  He does not have any smokeless tobacco history on file. His alcohol and drug histories are not on file.  Allergies: No Known Allergies  Medications:  Scheduled: . amLODipine  10 mg Oral Daily  . aspirin EC  81 mg Oral Daily  . lisinopril  2.5 mg Oral Daily  . metoprolol tartrate  25 mg Oral BID  . simvastatin  20 mg Oral q1800    Results for orders placed or performed  during the hospital encounter of 07/09/14 (from the past 48 hour(s))  CBC     Status: Abnormal   Collection Time: 07/10/14  3:22 AM  Result Value Ref Range   WBC 9.4 4.0 - 10.5 K/uL   RBC 4.11 (L) 4.22 - 5.81 MIL/uL   Hemoglobin 12.3 (L) 13.0 - 17.0 g/dL   HCT 37.2 (L) 39.0 - 52.0 %   MCV 90.5 78.0 - 100.0 fL   MCH 29.9 26.0 - 34.0 pg   MCHC 33.1 30.0 - 36.0 g/dL   RDW 13.2 11.5 - 15.5 %   Platelets 236 150 - 400 K/uL  Basic metabolic panel     Status: Abnormal   Collection Time: 07/10/14  3:22 AM  Result Value Ref Range   Sodium 140 135 - 145 mmol/L   Potassium 3.6 3.5 - 5.1 mmol/L   Chloride 104 96 - 112 mmol/L   CO2 26 19 - 32 mmol/L   Glucose, Bld 106 (H) 70 - 99 mg/dL   BUN <5 (L) 6 - 23 mg/dL   Creatinine, Ser 0.72 0.50 - 1.35 mg/dL   Calcium 8.6 8.4 - 10.5 mg/dL   GFR calc non Af Amer >90 >90 mL/min   GFR calc Af Amer >90 >90 mL/min    Comment: (NOTE) The eGFR has been calculated using the CKD EPI equation. This calculation has not been validated   in all clinical situations. eGFR's persistently <90 mL/min signify possible Chronic Kidney Disease.    Anion gap 10 5 - 15  Heparin level (unfractionated)     Status: None   Collection Time: 07/10/14  3:22 AM  Result Value Ref Range   Heparin Unfractionated 0.48 0.30 - 0.70 IU/mL    Comment:        IF HEPARIN RESULTS ARE BELOW EXPECTED VALUES, AND PATIENT DOSAGE HAS BEEN CONFIRMED, SUGGEST FOLLOW UP TESTING OF ANTITHROMBIN III LEVELS.   Protime-INR     Status: None   Collection Time: 07/10/14  3:22 AM  Result Value Ref Range   Prothrombin Time 14.2 11.6 - 15.2 seconds   INR 1.09 0.00 - 1.49  Lipid panel     Status: Abnormal   Collection Time: 07/10/14  3:22 AM  Result Value Ref Range   Cholesterol 117 0 - 200 mg/dL   Triglycerides 190 (H) <150 mg/dL   HDL 26 (L) >39 mg/dL   Total CHOL/HDL Ratio 4.5 RATIO   VLDL 38 0 - 40 mg/dL   LDL Cholesterol 53 0 - 99 mg/dL    Comment:        Total Cholesterol/HDL:CHD  Risk Coronary Heart Disease Risk Table                     Men   Women  1/2 Average Risk   3.4   3.3  Average Risk       5.0   4.4  2 X Average Risk   9.6   7.1  3 X Average Risk  23.4   11.0        Use the calculated Patient Ratio above and the CHD Risk Table to determine the patient's CHD Risk.        ATP III CLASSIFICATION (LDL):  <100     mg/dL   Optimal  100-129  mg/dL   Near or Above                    Optimal  130-159  mg/dL   Borderline  160-189  mg/dL   High  >190     mg/dL   Very High   CBC     Status: Abnormal   Collection Time: 07/11/14  5:41 AM  Result Value Ref Range   WBC 8.4 4.0 - 10.5 K/uL   RBC 4.19 (L) 4.22 - 5.81 MIL/uL   Hemoglobin 12.8 (L) 13.0 - 17.0 g/dL   HCT 37.7 (L) 39.0 - 52.0 %   MCV 90.0 78.0 - 100.0 fL   MCH 30.5 26.0 - 34.0 pg   MCHC 34.0 30.0 - 36.0 g/dL   RDW 13.3 11.5 - 15.5 %   Platelets 252 150 - 400 K/uL  Heparin level (unfractionated)     Status: None   Collection Time: 07/11/14  1:25 PM  Result Value Ref Range   Heparin Unfractionated 0.45 0.30 - 0.70 IU/mL    Comment:        IF HEPARIN RESULTS ARE BELOW EXPECTED VALUES, AND PATIENT DOSAGE HAS BEEN CONFIRMED, SUGGEST FOLLOW UP TESTING OF ANTITHROMBIN III LEVELS.     No results found.  Review of Systems  Constitutional: Positive for weight loss (has lost 50 pounds through diet and exercise).  Respiratory: Positive for shortness of breath.   Cardiovascular: Positive for chest pain and orthopnea.  All other systems reviewed and are negative.  Blood pressure 122/68, pulse 93, temperature 99.2 F (37.3 C),   temperature source Oral, resp. rate 18, height 6' 3" (1.905 m), weight 277 lb (125.646 kg), SpO2 99 %. Physical Exam  Vitals reviewed. Constitutional: He is oriented to person, place, and time. He appears well-developed and well-nourished. No distress.  HENT:  Head: Normocephalic and atraumatic.  Eyes: EOM are normal. Pupils are equal, round, and reactive to light.   Neck: Neck supple. No thyromegaly present.  Cardiovascular: Normal rate, regular rhythm, normal heart sounds and intact distal pulses.   No murmur heard. + right carotid bruit  Respiratory: Effort normal and breath sounds normal. He has no wheezes.  GI: Soft. There is no tenderness.  Musculoskeletal: He exhibits no edema.  Lymphadenopathy:    He has no cervical adenopathy.  Neurological: He is alert and oriented to person, place, and time. No cranial nerve deficit.  No motor deficit  Skin: Skin is warm and dry.    Assessment/Plan: 54 yo man with multiple CRF presented with a NSTEMI. At catheterization he has severe 3 vessel CAD with preserved LV function.  CABG is indicated for survival benefit and relief of symptoms.  I discussed the general nature of the procedure, the need for general anesthesia, the use of cardiopulmonary bypass, and the incisions to be used with Mr and Mrs Evinger. I discussed the expected hospital stay, overall recovery and short and long term outcomes. He understands CABG is palliative and not curative. I reviewed the indications, risks, benefits and alternatives. They understand the risks include, but are not limited to death, stroke, MI, DVT/PE, bleeding, possible need for transfusion, infections, early/ late graft occlusion, cardiac arrhythmias, and other organ system dysfunction including respiratory, renal, or GI complications.   He accepts the risks and agrees to proceed.  Will plan to use bilateral IMA due to young age.  First available OR presently is Friday AM. Melrose Nakayama 07/11/2014, 5:58 PM

## 2014-07-11 NOTE — Progress Notes (Signed)
ANTICOAGULATION CONSULT NOTE - Follow Up Consult  Pharmacy Consult for Heparin Indication: NSTEMI  No Known Allergies  Patient Measurements: Height: 6\' 3"  (190.5 cm) Weight: 277 lb (125.646 kg) IBW/kg (Calculated) : 84.5 Heparin Dosing Weight:   Vital Signs: Temp: 99.2 F (37.3 C) (04/19 0459) Temp Source: Oral (04/19 0459) BP: 122/68 mmHg (04/19 0952) Pulse Rate: 93 (04/19 0952)  Labs:  Recent Labs  07/10/14 0322 07/11/14 0541 07/11/14 1325  HGB 12.3* 12.8*  --   HCT 37.2* 37.7*  --   PLT 236 252  --   LABPROT 14.2  --   --   INR 1.09  --   --   HEPARINUNFRC 0.48  --  0.45  CREATININE 0.72  --   --     Estimated Creatinine Clearance: 154.2 mL/min (by C-G formula based on Cr of 0.72).   Medications:  Scheduled:  . amLODipine  10 mg Oral Daily  . aspirin EC  81 mg Oral Daily  . lisinopril  2.5 mg Oral Daily  . metoprolol tartrate  25 mg Oral BID  . simvastatin  20 mg Oral q1800    Assessment: 54yo male with NSTEMI. Cath=3VCAD. Heparin resumed post cath. Continues to have therapeutic level on 1800 units/hr. CBC stable, no bleeding noted.  Goal of Therapy:  Heparin level 0.3-0.7 units/ml Monitor platelets by anticoagulation protocol: Yes   Plan:  Follow up surgery plans Continue heparin at 1800 units/hr Daily heparin level and CBC  Sheppard CoilFrank Carolyna Yerian PharmD., BCPS Clinical Pharmacist Pager [phone] 07/11/2014 2:20 PM

## 2014-07-12 DIAGNOSIS — I2510 Atherosclerotic heart disease of native coronary artery without angina pectoris: Secondary | ICD-10-CM

## 2014-07-12 LAB — CBC
HCT: 37.2 % — ABNORMAL LOW (ref 39.0–52.0)
Hemoglobin: 12.6 g/dL — ABNORMAL LOW (ref 13.0–17.0)
MCH: 30.1 pg (ref 26.0–34.0)
MCHC: 33.9 g/dL (ref 30.0–36.0)
MCV: 89 fL (ref 78.0–100.0)
Platelets: 233 K/uL (ref 150–400)
RBC: 4.18 MIL/uL — ABNORMAL LOW (ref 4.22–5.81)
RDW: 13.5 % (ref 11.5–15.5)
WBC: 7.2 K/uL (ref 4.0–10.5)

## 2014-07-12 LAB — HEPARIN LEVEL (UNFRACTIONATED): Heparin Unfractionated: 0.3 IU/mL (ref 0.30–0.70)

## 2014-07-12 NOTE — Progress Notes (Signed)
   Subjective: Denies CP  NO SOB   Objective: Filed Vitals:   07/11/14 0459 07/11/14 0952 07/11/14 2004 07/12/14 0443  BP: 115/61 122/68 140/82 117/77  Pulse: 80 93 89 70  Temp: 99.2 F (37.3 C)  99 F (37.2 C) 99 F (37.2 C)  TempSrc: Oral  Oral Oral  Resp: 18  18 18   Height:      Weight:      SpO2: 99%  100% 99%   Weight change:  No intake or output data in the 24 hours ending 07/12/14 0953  General: Alert, awake, oriented x3, in no acute distress Neck:  JVP is normal Heart: Regular rate and rhythm, without murmurs, rubs, gallops.  Lungs: Clear to auscultation.  No rales or wheezes. Exemities:  No edema.   Neuro: Grossly intact, nonfocal.  Tele SR   Lab Results: Results for orders placed or performed during the hospital encounter of 07/09/14 (from the past 24 hour(s))  Heparin level (unfractionated)     Status: None   Collection Time: 07/11/14  1:25 PM  Result Value Ref Range   Heparin Unfractionated 0.45 0.30 - 0.70 IU/mL  CBC     Status: Abnormal   Collection Time: 07/12/14  4:46 AM  Result Value Ref Range   WBC 7.2 4.0 - 10.5 K/uL   RBC 4.18 (L) 4.22 - 5.81 MIL/uL   Hemoglobin 12.6 (L) 13.0 - 17.0 g/dL   HCT 16.137.2 (L) 09.639.0 - 04.552.0 %   MCV 89.0 78.0 - 100.0 fL   MCH 30.1 26.0 - 34.0 pg   MCHC 33.9 30.0 - 36.0 g/dL   RDW 40.913.5 81.111.5 - 91.415.5 %   Platelets 233 150 - 400 K/uL  Heparin level (unfractionated)     Status: None   Collection Time: 07/12/14  4:46 AM  Result Value Ref Range   Heparin Unfractionated 0.30 0.30 - 0.70 IU/mL    Studies/Results: No results found.  Medications: REviewed  @PROBHOSP @  1  CAD 3V CAD at cath  Plan for surgery on Friday.    2  HTN  Good control  3.  HL  Continue statuin    LOS: 3 days   Benjamin King 07/12/2014, 9:53 AM

## 2014-07-12 NOTE — Progress Notes (Addendum)
Pre-op Cardiac Surgery  Carotid Findings:  Bilateral:  1-39% ICA stenosis.  Vertebral artery flow is antegrade.      Upper Extremity Right Left  Brachial Pressures 120   Radial Waveforms Tri Tri  Ulnar Waveforms Tri Tri  Palmar Arch (Allen's Test) Decreases >50% with radial compression, normal with ulnar compression  Decreases >50% with radial compression, normal with ulnar compression    Findings:  Palpable pedal pulses.   Farrel DemarkJill Eunice, RDMS, RVT 07/12/2014

## 2014-07-12 NOTE — Progress Notes (Signed)
ANTICOAGULATION CONSULT NOTE - Follow Up Consult  Pharmacy Consult for Heparin Indication: NSTEMI awaiting CABG  No Known Allergies  Patient Measurements: Height: 6\' 3"  (190.5 cm) Weight: 277 lb (125.646 kg) IBW/kg (Calculated) : 84.5 Heparin Dosing Weight: 111kg  Vital Signs: Temp: 99 F (37.2 C) (04/20 0443) Temp Source: Oral (04/20 0443) BP: 117/77 mmHg (04/20 0443) Pulse Rate: 70 (04/20 0443)  Labs:  Recent Labs  07/10/14 0322 07/11/14 0541 07/11/14 1325 07/12/14 0446  HGB 12.3* 12.8*  --  12.6*  HCT 37.2* 37.7*  --  37.2*  PLT 236 252  --  233  LABPROT 14.2  --   --   --   INR 1.09  --   --   --   HEPARINUNFRC 0.48  --  0.45 0.30  CREATININE 0.72  --   --   --     Estimated Creatinine Clearance: 154.2 mL/min (by C-G formula based on Cr of 0.72).   Assessment: 54yo male with NSTEMI. Cath=3VCAD. Heparin resumed post cath with consult to CVTS placed. Plans for CABG on Friday. Continues to have therapeutic level on 1800 units/hr, however level this morning is just in therapeutic range at 0.3 units/mL. CBC stable, no bleeding noted.  Goal of Therapy:  Heparin level 0.3-0.7 units/ml Monitor platelets by anticoagulation protocol: Yes   Plan:  - increase heparin to 1900 units/hr to ensure level stays therapeutic - Daily heparin level and CBC - follow s/s bleeding  Braiden Rodman D. Honour Schwieger, PharmD, BCPS Clinical Pharmacist Pager: [phone] 07/12/2014 8:36 AM

## 2014-07-13 ENCOUNTER — Inpatient Hospital Stay (HOSPITAL_COMMUNITY): Payer: 59

## 2014-07-13 LAB — BLOOD GAS, ARTERIAL
Acid-base deficit: 2 mmol/L (ref 0.0–2.0)
BICARBONATE: 22.4 meq/L (ref 20.0–24.0)
Drawn by: 12971
FIO2: 0.21 %
O2 SAT: 96.7 %
PO2 ART: 91.9 mmHg (ref 80.0–100.0)
Patient temperature: 98.6
TCO2: 23.5 mmol/L (ref 0–100)
pCO2 arterial: 38.8 mmHg (ref 35.0–45.0)
pH, Arterial: 7.379 (ref 7.350–7.450)

## 2014-07-13 LAB — TYPE AND SCREEN
ABO/RH(D): O POS
ANTIBODY SCREEN: NEGATIVE

## 2014-07-13 LAB — CBC
HCT: 38 % — ABNORMAL LOW (ref 39.0–52.0)
Hemoglobin: 12.8 g/dL — ABNORMAL LOW (ref 13.0–17.0)
MCH: 30.3 pg (ref 26.0–34.0)
MCHC: 33.7 g/dL (ref 30.0–36.0)
MCV: 89.8 fL (ref 78.0–100.0)
PLATELETS: 228 10*3/uL (ref 150–400)
RBC: 4.23 MIL/uL (ref 4.22–5.81)
RDW: 13.4 % (ref 11.5–15.5)
WBC: 7.3 10*3/uL (ref 4.0–10.5)

## 2014-07-13 LAB — APTT: APTT: 148 s — AB (ref 24–37)

## 2014-07-13 LAB — HEPARIN LEVEL (UNFRACTIONATED): Heparin Unfractionated: 0.52 IU/mL (ref 0.30–0.70)

## 2014-07-13 LAB — ABO/RH: ABO/RH(D): O POS

## 2014-07-13 MED ORDER — DEXTROSE 5 % IV SOLN
750.0000 mg | INTRAVENOUS | Status: DC
  Filled 2014-07-13: qty 750

## 2014-07-13 MED ORDER — PLASMA-LYTE 148 IV SOLN
INTRAVENOUS | Status: AC
  Administered 2014-07-14: 500 mL
  Filled 2014-07-13: qty 2.5

## 2014-07-13 MED ORDER — DOPAMINE-DEXTROSE 3.2-5 MG/ML-% IV SOLN
0.0000 ug/kg/min | INTRAVENOUS | Status: DC
  Filled 2014-07-13: qty 250

## 2014-07-13 MED ORDER — DIAZEPAM 5 MG PO TABS
5.0000 mg | ORAL_TABLET | Freq: Once | ORAL | Status: AC
  Administered 2014-07-14: 5 mg via ORAL
  Filled 2014-07-13: qty 1

## 2014-07-13 MED ORDER — NITROGLYCERIN IN D5W 200-5 MCG/ML-% IV SOLN
2.0000 ug/min | INTRAVENOUS | Status: AC
  Administered 2014-07-14: 5 ug/min via INTRAVENOUS
  Filled 2014-07-13: qty 250

## 2014-07-13 MED ORDER — DEXTROSE 5 % IV SOLN
1.5000 g | INTRAVENOUS | Status: AC
  Administered 2014-07-14: 1.5 g via INTRAVENOUS
  Administered 2014-07-14: .75 g via INTRAVENOUS
  Filled 2014-07-13: qty 1.5

## 2014-07-13 MED ORDER — CHLORHEXIDINE GLUCONATE CLOTH 2 % EX PADS: 6.0000 | MEDICATED_PAD | Freq: Once | CUTANEOUS | Status: DC

## 2014-07-13 MED ORDER — METOPROLOL TARTRATE 12.5 MG HALF TABLET
12.5000 mg | ORAL_TABLET | Freq: Once | ORAL | Status: AC
  Administered 2014-07-14: 12.5 mg via ORAL
  Filled 2014-07-13: qty 1

## 2014-07-13 MED ORDER — BISACODYL 5 MG PO TBEC
5.0000 mg | DELAYED_RELEASE_TABLET | Freq: Once | ORAL | Status: DC
  Filled 2014-07-13: qty 1

## 2014-07-13 MED ORDER — POTASSIUM CHLORIDE 2 MEQ/ML IV SOLN
80.0000 meq | INTRAVENOUS | Status: DC
  Filled 2014-07-13: qty 40

## 2014-07-13 MED ORDER — SODIUM CHLORIDE 0.9 % IV SOLN
INTRAVENOUS | Status: AC
  Administered 2014-07-14: 70 mL/h via INTRAVENOUS
  Filled 2014-07-13: qty 40

## 2014-07-13 MED ORDER — EPINEPHRINE HCL 1 MG/ML IJ SOLN
0.0000 ug/min | INTRAVENOUS | Status: DC
  Filled 2014-07-13: qty 4

## 2014-07-13 MED ORDER — MAGNESIUM SULFATE 50 % IJ SOLN
40.0000 meq | INTRAMUSCULAR | Status: DC
  Filled 2014-07-13: qty 10

## 2014-07-13 MED ORDER — TEMAZEPAM 15 MG PO CAPS
15.0000 mg | ORAL_CAPSULE | Freq: Once | ORAL | Status: AC | PRN
  Administered 2014-07-13: 15 mg via ORAL
  Filled 2014-07-13: qty 1

## 2014-07-13 MED ORDER — VANCOMYCIN HCL 10 G IV SOLR
1250.0000 mg | INTRAVENOUS | Status: AC
  Administered 2014-07-14: 1250 mg via INTRAVENOUS
  Filled 2014-07-13: qty 1250

## 2014-07-13 MED ORDER — SODIUM CHLORIDE 0.9 % IV SOLN
INTRAVENOUS | Status: AC
  Administered 2014-07-14: 1 [IU]/h via INTRAVENOUS
  Filled 2014-07-13: qty 2.5

## 2014-07-13 MED ORDER — DEXMEDETOMIDINE HCL IN NACL 400 MCG/100ML IV SOLN
0.1000 ug/kg/h | INTRAVENOUS | Status: AC
  Administered 2014-07-14: 0.2 ug/kg/h via INTRAVENOUS
  Filled 2014-07-13: qty 100

## 2014-07-13 MED ORDER — CHLORHEXIDINE GLUCONATE CLOTH 2 % EX PADS
6.0000 | MEDICATED_PAD | Freq: Once | CUTANEOUS | Status: AC
  Administered 2014-07-13: 6 via TOPICAL

## 2014-07-13 MED ORDER — SODIUM CHLORIDE 0.9 % IV SOLN
INTRAVENOUS | Status: DC
  Filled 2014-07-13: qty 30

## 2014-07-13 MED ORDER — ALPRAZOLAM 0.25 MG PO TABS
0.2500 mg | ORAL_TABLET | ORAL | Status: DC | PRN
  Administered 2014-07-13: 0.25 mg via ORAL
  Administered 2014-07-13: 0.5 mg via ORAL
  Filled 2014-07-13: qty 2
  Filled 2014-07-13: qty 1

## 2014-07-13 MED ORDER — PHENYLEPHRINE HCL 10 MG/ML IJ SOLN
30.0000 ug/min | INTRAVENOUS | Status: DC
  Administered 2014-07-14 (×2): 15 ug/min via INTRAVENOUS
  Filled 2014-07-13: qty 2

## 2014-07-13 NOTE — Progress Notes (Signed)
ANTICOAGULATION CONSULT NOTE - Follow Up Consult  Pharmacy Consult for Heparin Indication: NSTEMI awaiting CABG  No Known Allergies  Patient Measurements: Height: 6\' 3"  (190.5 cm) Weight: 277 lb (125.646 kg) IBW/kg (Calculated) : 84.5 Heparin Dosing Weight: 111kg  Vital Signs: Temp: 98.2 F (36.8 C) (04/21 0434) Temp Source: Oral (04/21 0434) BP: 106/55 mmHg (04/21 0434) Pulse Rate: 54 (04/21 0434)  Labs:  Recent Labs  07/11/14 0541 07/11/14 1325 07/12/14 0446 07/13/14 0439  HGB 12.8*  --  12.6* 12.8*  HCT 37.7*  --  37.2* 38.0*  PLT 252  --  233 228  HEPARINUNFRC  --  0.45 0.30 0.52    Estimated Creatinine Clearance: 154.2 mL/min (by C-G formula based on Cr of 0.72).   Assessment: 54yo male with NSTEMI. Cath=3VCAD. Heparin resumed post cath and plans for CABG on Friday. Heparin is at goal (HL= 0.52 on 1900 units/hr). CBC stable, no bleeding noted.  Goal of Therapy:  Heparin level 0.3-0.7 units/ml Monitor platelets by anticoagulation protocol: Yes   Plan:  -Continue heparin at 1900 units/hr - Daily heparin level and CBC  Harland GermanAndrew Aslynn Brunetti, Pharm D 07/13/2014 7:12 AM

## 2014-07-13 NOTE — Progress Notes (Signed)
   Subjective: Denies CP  Does get occasional SOB  Objective: Filed Vitals:   07/12/14 0443 07/12/14 1010 07/12/14 2012 07/13/14 0434  BP: 117/77 120/72 109/73 106/55  Pulse: 70 73 69 54  Temp: 99 F (37.2 C) 98.4 F (36.9 C) 98.1 F (36.7 C) 98.2 F (36.8 C)  TempSrc: Oral Oral Oral Oral  Resp: 18 18 18 18   Height:      Weight:      SpO2: 99% 98% 100% 96%   Weight change:   Intake/Output Summary (Last 24 hours) at 07/13/14 0815 Last data filed at 07/12/14 1230  Gross per 24 hour  Intake    240 ml  Output      0 ml  Net    240 ml   Tele:  SR   General: Alert, awake, oriented x3, in no acute distress Neck:  JVP is normal Heart: Regular rate and rhythm, without murmurs, rubs, gallops.  Lungs: Clear to auscultation.  No rales or wheezes. Exemities:  No edema.   Neuro: Grossly intact, nonfocal.   Lab Results: Results for orders placed or performed during the hospital encounter of 07/09/14 (from the past 24 hour(s))  CBC     Status: Abnormal   Collection Time: 07/13/14  4:39 AM  Result Value Ref Range   WBC 7.3 4.0 - 10.5 K/uL   RBC 4.23 4.22 - 5.81 MIL/uL   Hemoglobin 12.8 (L) 13.0 - 17.0 g/dL   HCT 16.138.0 (L) 09.639.0 - 04.552.0 %   MCV 89.8 78.0 - 100.0 fL   MCH 30.3 26.0 - 34.0 pg   MCHC 33.7 30.0 - 36.0 g/dL   RDW 40.913.4 81.111.5 - 91.415.5 %   Platelets 228 150 - 400 K/uL  Heparin level (unfractionated)     Status: None   Collection Time: 07/13/14  4:39 AM  Result Value Ref Range   Heparin Unfractionated 0.52 0.30 - 0.70 IU/mL    Studies/Results: No results found.  Medications: REviewed     @PROBHOSP @  1  CAD  3V CAD  SUrgery tomorrow  Patient anxious.    2.  HTN  Good control    3.  HL  Keep on statin    LOS: 4 days   Dietrich Patesaula Saahir Prude 07/13/2014, 8:15 AM

## 2014-07-13 NOTE — Progress Notes (Signed)
CARDIAC REHAB PHASE I   PRE:  Rate/Rhythm: 63 Sr  BP:  Sitting: 115/78        SaO2: 100 RA  MODE:  Ambulation: 500 ft   POST:  Rate/Rhythm: 64 SR  BP:  Sitting: 118/77         SaO2: 100 Ra  Pt ambulated 500 ft on RA, IV, steady gait, tolerated well. Pt states he has not yet used his IS and feels anxious about his surgery.  Reviewed IS, sternal precautions, activity progression. Pt verbalized understanding.   [id number]    Joylene GrapesMonge, Pria Klosinski C, RN, BSN 07/13/2014 1:31 PM

## 2014-07-13 NOTE — Progress Notes (Signed)
3 Days Post-Op Procedure(s) (LRB): LEFT HEART CATHETERIZATION WITH CORONARY ANGIOGRAM (N/A) Subjective: No chest pain, anxious about procedure  Objective: Vital signs in last 24 hours: Temp:  [98.1 F (36.7 C)-98.2 F (36.8 C)] 98.2 F (36.8 C) (04/21 0434) Pulse Rate:  [54-79] 79 (04/21 1029) Cardiac Rhythm:  [-] Normal sinus rhythm (04/21 0750) Resp:  [18] 18 (04/21 0434) BP: (106-117)/(55-76) 117/76 mmHg (04/21 1029) SpO2:  [96 %-100 %] 96 % (04/21 0434)  Hemodynamic parameters for last 24 hours:    Intake/Output from previous day: 04/20 0701 - 04/21 0700 In: 480 [P.O.:480] Out: -  Intake/Output this shift:    General appearance: alert and no distress Neurologic: intact Heart: regular rate and rhythm Lungs: clear to auscultation bilaterally  Lab Results:  Recent Labs  07/12/14 0446 07/13/14 0439  WBC 7.2 7.3  HGB 12.6* 12.8*  HCT 37.2* 38.0*  PLT 233 228   BMET: No results for input(s): NA, K, CL, CO2, GLUCOSE, BUN, CREATININE, CALCIUM in the last 72 hours.  PT/INR: No results for input(s): LABPROT, INR in the last 72 hours. ABG No results found for: PHART, HCO3, TCO2, ACIDBASEDEF, O2SAT CBG (last 3)  No results for input(s): GLUCAP in the last 72 hours.  Assessment/Plan: S/P Procedure(s) (LRB): LEFT HEART CATHETERIZATION WITH CORONARY ANGIOGRAM (N/A) FOR CABG tomorrow   Carotids OK, Allen's test abnormal bilaterally PFTs OK He is aware of risks and benefits of surgery. He is understandably anxious but does not have any questions at this time      LOS: 4 days    Loreli SlotSteven C Zailee Vallely 07/13/2014

## 2014-07-13 NOTE — Progress Notes (Signed)
Utilization review completed.  

## 2014-07-14 ENCOUNTER — Inpatient Hospital Stay (HOSPITAL_COMMUNITY): Payer: 59 | Admitting: Certified Registered"

## 2014-07-14 ENCOUNTER — Inpatient Hospital Stay (HOSPITAL_COMMUNITY): Payer: 59

## 2014-07-14 ENCOUNTER — Encounter (HOSPITAL_COMMUNITY)
Admission: AD | Disposition: A | Discharge status: Home / Self Care | Payer: BLUE CROSS/BLUE SHIELD | Source: Other Acute Inpatient Hospital | Attending: Thoracic Surgery (Cardiothoracic Vascular Surgery)

## 2014-07-14 DIAGNOSIS — Z951 Presence of aortocoronary bypass graft: Secondary | ICD-10-CM

## 2014-07-14 HISTORY — PX: CORONARY ARTERY BYPASS GRAFT: SHX141

## 2014-07-14 HISTORY — PX: TEE WITHOUT CARDIOVERSION: SHX5443

## 2014-07-14 LAB — POCT I-STAT, CHEM 8
BUN: 6 mg/dL (ref 6–23)
BUN: 7 mg/dL (ref 6–23)
BUN: 7 mg/dL (ref 6–23)
BUN: 8 mg/dL (ref 6–23)
BUN: 7 mg/dL (ref 6–23)
CALCIUM ION: 1.14 mmol/L (ref 1.12–1.23)
CALCIUM ION: 1.12 mmol/L (ref 1.12–1.23)
CHLORIDE: 100 mmol/L (ref 96–112)
CREATININE: 0.7 mg/dL (ref 0.50–1.35)
Calcium, Ion: 1.1 mmol/L — ABNORMAL LOW (ref 1.12–1.23)
Calcium, Ion: 1.21 mmol/L (ref 1.12–1.23)
Calcium, Ion: 1.12 mmol/L (ref 1.12–1.23)
Chloride: 103 mmol/L (ref 96–112)
Chloride: 102 mmol/L (ref 96–112)
Chloride: 104 mmol/L (ref 96–112)
Chloride: 105 mmol/L (ref 96–112)
Creatinine, Ser: 0.5 mg/dL (ref 0.50–1.35)
Creatinine, Ser: 0.6 mg/dL (ref 0.50–1.35)
Creatinine, Ser: 0.6 mg/dL (ref 0.50–1.35)
Creatinine, Ser: 0.6 mg/dL (ref 0.50–1.35)
GLUCOSE: 111 mg/dL — AB (ref 70–99)
GLUCOSE: 157 mg/dL — AB (ref 70–99)
Glucose, Bld: 121 mg/dL — ABNORMAL HIGH (ref 70–99)
Glucose, Bld: 153 mg/dL — ABNORMAL HIGH (ref 70–99)
Glucose, Bld: 153 mg/dL — ABNORMAL HIGH (ref 70–99)
HCT: 35 % — ABNORMAL LOW (ref 39.0–52.0)
HCT: 29 % — ABNORMAL LOW (ref 39.0–52.0)
HEMATOCRIT: 29 % — AB (ref 39.0–52.0)
HEMATOCRIT: 30 % — AB (ref 39.0–52.0)
HEMATOCRIT: 30 % — AB (ref 39.0–52.0)
HEMOGLOBIN: 11.9 g/dL — AB (ref 13.0–17.0)
HEMOGLOBIN: 10.2 g/dL — AB (ref 13.0–17.0)
Hemoglobin: 9.9 g/dL — ABNORMAL LOW (ref 13.0–17.0)
Hemoglobin: 10.2 g/dL — ABNORMAL LOW (ref 13.0–17.0)
Hemoglobin: 9.9 g/dL — ABNORMAL LOW (ref 13.0–17.0)
POTASSIUM: 3.7 mmol/L (ref 3.5–5.1)
POTASSIUM: 4.6 mmol/L (ref 3.5–5.1)
Potassium: 4.3 mmol/L (ref 3.5–5.1)
Potassium: 4.9 mmol/L (ref 3.5–5.1)
Potassium: 5.3 mmol/L — ABNORMAL HIGH (ref 3.5–5.1)
Sodium: 137 mmol/L (ref 135–145)
Sodium: 140 mmol/L (ref 135–145)
Sodium: 137 mmol/L (ref 135–145)
Sodium: 138 mmol/L (ref 135–145)
Sodium: 139 mmol/L (ref 135–145)
TCO2: 23 mmol/L (ref 0–100)
TCO2: 24 mmol/L (ref 0–100)
TCO2: 23 mmol/L (ref 0–100)
TCO2: 22 mmol/L (ref 0–100)
TCO2: 21 mmol/L (ref 0–100)

## 2014-07-14 LAB — CBC
HCT: 37.4 % — ABNORMAL LOW (ref 39.0–52.0)
HEMATOCRIT: 31.9 % — AB (ref 39.0–52.0)
HEMOGLOBIN: 11.4 g/dL — AB (ref 13.0–17.0)
Hemoglobin: 12.7 g/dL — ABNORMAL LOW (ref 13.0–17.0)
MCH: 30.3 pg (ref 26.0–34.0)
MCH: 31.5 pg (ref 26.0–34.0)
MCHC: 34 g/dL (ref 30.0–36.0)
MCHC: 35.7 g/dL (ref 30.0–36.0)
MCV: 89.3 fL (ref 78.0–100.0)
MCV: 88.1 fL (ref 78.0–100.0)
PLATELETS: 229 10*3/uL (ref 150–400)
Platelets: 125 10*3/uL — ABNORMAL LOW (ref 150–400)
RBC: 4.19 MIL/uL — ABNORMAL LOW (ref 4.22–5.81)
RBC: 3.62 MIL/uL — ABNORMAL LOW (ref 4.22–5.81)
RDW: 13.3 % (ref 11.5–15.5)
RDW: 13 % (ref 11.5–15.5)
WBC: 6.9 10*3/uL (ref 4.0–10.5)
WBC: 14.7 10*3/uL — AB (ref 4.0–10.5)

## 2014-07-14 LAB — URINALYSIS, ROUTINE W REFLEX MICROSCOPIC
BILIRUBIN URINE: NEGATIVE
GLUCOSE, UA: NEGATIVE mg/dL
Ketones, ur: NEGATIVE mg/dL
LEUKOCYTES UA: NEGATIVE
Nitrite: NEGATIVE
PH: 5.5 (ref 5.0–8.0)
Protein, ur: NEGATIVE mg/dL
SPECIFIC GRAVITY, URINE: 1.017 (ref 1.005–1.030)
Urobilinogen, UA: 1 mg/dL (ref 0.0–1.0)

## 2014-07-14 LAB — POCT I-STAT 3, ART BLOOD GAS (G3+)
Acid-Base Excess: 1 mmol/L (ref 0.0–2.0)
Acid-base deficit: 3 mmol/L — ABNORMAL HIGH (ref 0.0–2.0)
Bicarbonate: 25.7 mEq/L — ABNORMAL HIGH (ref 20.0–24.0)
Bicarbonate: 22.9 mEq/L (ref 20.0–24.0)
O2 Saturation: 100 %
O2 Saturation: 100 %
PCO2 ART: 46.8 mmHg — AB (ref 35.0–45.0)
PH ART: 7.298 — AB (ref 7.350–7.450)
PO2 ART: 339 mmHg — AB (ref 80.0–100.0)
TCO2: 27 mmol/L (ref 0–100)
TCO2: 24 mmol/L (ref 0–100)
pCO2 arterial: 41 mmHg (ref 35.0–45.0)
pH, Arterial: 7.406 (ref 7.350–7.450)
pO2, Arterial: 245 mmHg — ABNORMAL HIGH (ref 80.0–100.0)

## 2014-07-14 LAB — URINE MICROSCOPIC-ADD ON

## 2014-07-14 LAB — HEMOGLOBIN AND HEMATOCRIT, BLOOD
HEMATOCRIT: 28.3 % — AB (ref 39.0–52.0)
HEMOGLOBIN: 9.7 g/dL — AB (ref 13.0–17.0)

## 2014-07-14 LAB — PLATELET COUNT: Platelets: 176 10*3/uL (ref 150–400)

## 2014-07-14 LAB — PROTIME-INR
INR: 1.25 (ref 0.00–1.49)
PROTHROMBIN TIME: 15.8 s — AB (ref 11.6–15.2)

## 2014-07-14 LAB — HEPARIN LEVEL (UNFRACTIONATED): HEPARIN UNFRACTIONATED: 0.64 [IU]/mL (ref 0.30–0.70)

## 2014-07-14 LAB — HEMOGLOBIN A1C
Hgb A1c MFr Bld: 6.3 % — ABNORMAL HIGH (ref 4.8–5.6)
Mean Plasma Glucose: 134 mg/dL

## 2014-07-14 LAB — APTT: aPTT: 35 seconds (ref 24–37)

## 2014-07-14 LAB — MRSA PCR SCREENING: MRSA BY PCR: NEGATIVE

## 2014-07-14 SURGERY — CORONARY ARTERY BYPASS GRAFTING (CABG)
Anesthesia: General | Site: Chest

## 2014-07-14 MED ORDER — FENTANYL CITRATE (PF) 250 MCG/5ML IJ SOLN
INTRAMUSCULAR | Status: AC
  Filled 2014-07-14: qty 5

## 2014-07-14 MED ORDER — OXYCODONE HCL 5 MG PO TABS
5.0000 mg | ORAL_TABLET | ORAL | Status: DC | PRN
  Administered 2014-07-15 – 2014-07-20 (×17): 10 mg via ORAL
  Filled 2014-07-14 (×17): qty 2

## 2014-07-14 MED ORDER — GLYCOPYRROLATE 0.2 MG/ML IJ SOLN
INTRAMUSCULAR | Status: AC
  Filled 2014-07-14: qty 1

## 2014-07-14 MED ORDER — METOPROLOL TARTRATE 1 MG/ML IV SOLN
2.5000 mg | INTRAVENOUS | Status: DC | PRN
Start: 2014-07-14 — End: 2014-07-20
  Administered 2014-07-16 – 2014-07-17 (×4): 5 mg via INTRAVENOUS
  Filled 2014-07-14 (×4): qty 5

## 2014-07-14 MED ORDER — LACTATED RINGERS IV SOLN: 500.0000 mL | Freq: Once | INTRAVENOUS | Status: AC | PRN

## 2014-07-14 MED ORDER — POTASSIUM CHLORIDE 10 MEQ/50ML IV SOLN: 10.0000 meq | INTRAVENOUS | Status: AC

## 2014-07-14 MED ORDER — SODIUM CHLORIDE 0.9 % IV SOLN
INTRAVENOUS | Status: DC | PRN
  Administered 2014-07-14: 18:00:00 via INTRAVENOUS

## 2014-07-14 MED ORDER — DEXMEDETOMIDINE HCL IN NACL 200 MCG/50ML IV SOLN
0.0000 ug/kg/h | INTRAVENOUS | Status: DC
  Filled 2014-07-14 (×2): qty 50

## 2014-07-14 MED ORDER — LACTATED RINGERS IV SOLN
Freq: Once | INTRAVENOUS | Status: AC
  Administered 2014-07-14: 11:00:00 via INTRAVENOUS

## 2014-07-14 MED ORDER — ALBUMIN HUMAN 5 % IV SOLN
INTRAVENOUS | Status: DC | PRN
  Administered 2014-07-14: 18:00:00 via INTRAVENOUS

## 2014-07-14 MED ORDER — PHENYLEPHRINE 40 MCG/ML (10ML) SYRINGE FOR IV PUSH (FOR BLOOD PRESSURE SUPPORT)
PREFILLED_SYRINGE | INTRAVENOUS | Status: AC
  Filled 2014-07-14: qty 10

## 2014-07-14 MED ORDER — MORPHINE SULFATE 2 MG/ML IJ SOLN
1.0000 mg | INTRAMUSCULAR | Status: DC | PRN
  Administered 2014-07-14: 2 mg via INTRAVENOUS
  Administered 2014-07-14: 4 mg via INTRAVENOUS
  Administered 2014-07-15 (×2): 2 mg via INTRAVENOUS
  Filled 2014-07-14: qty 2
  Filled 2014-07-14: qty 1

## 2014-07-14 MED ORDER — PROTAMINE SULFATE 10 MG/ML IV SOLN
INTRAVENOUS | Status: DC | PRN
  Administered 2014-07-14: 20 mg via INTRAVENOUS
  Administered 2014-07-14: 50 mg via INTRAVENOUS
  Administered 2014-07-14 (×2): 100 mg via INTRAVENOUS
  Administered 2014-07-14: 30 mg via INTRAVENOUS
  Administered 2014-07-14: 50 mg via INTRAVENOUS

## 2014-07-14 MED ORDER — ROCURONIUM BROMIDE 100 MG/10ML IV SOLN
INTRAVENOUS | Status: DC | PRN
  Administered 2014-07-14: 100 mg via INTRAVENOUS
  Administered 2014-07-14: 20 mg via INTRAVENOUS
  Administered 2014-07-14: 50 mg via INTRAVENOUS
  Administered 2014-07-14 (×2): 30 mg via INTRAVENOUS

## 2014-07-14 MED ORDER — VANCOMYCIN HCL IN DEXTROSE 1-5 GM/200ML-% IV SOLN
1000.0000 mg | Freq: Once | INTRAVENOUS | Status: AC
  Administered 2014-07-15: 1000 mg via INTRAVENOUS
  Filled 2014-07-14: qty 200

## 2014-07-14 MED ORDER — DEXTROSE 5 % IV SOLN
0.0000 ug/min | INTRAVENOUS | Status: DC
  Filled 2014-07-14: qty 2

## 2014-07-14 MED ORDER — SODIUM CHLORIDE 0.45 % IV SOLN
INTRAVENOUS | Status: DC | PRN
  Administered 2014-07-14: 19:00:00 via INTRAVENOUS

## 2014-07-14 MED ORDER — MIDAZOLAM HCL 2 MG/2ML IJ SOLN
INTRAMUSCULAR | Status: AC
  Administered 2014-07-14: 2 mg
  Filled 2014-07-14: qty 2

## 2014-07-14 MED ORDER — BISACODYL 5 MG PO TBEC
10.0000 mg | DELAYED_RELEASE_TABLET | Freq: Every day | ORAL | Status: DC
  Administered 2014-07-15 – 2014-07-18 (×3): 10 mg via ORAL
  Filled 2014-07-14 (×5): qty 2

## 2014-07-14 MED ORDER — BISACODYL 10 MG RE SUPP: 10.0000 mg | Freq: Every day | RECTAL | Status: DC

## 2014-07-14 MED ORDER — ACETAMINOPHEN 500 MG PO TABS
1000.0000 mg | ORAL_TABLET | Freq: Four times a day (QID) | ORAL | Status: AC
  Administered 2014-07-15 – 2014-07-19 (×19): 1000 mg via ORAL
  Filled 2014-07-14 (×19): qty 2

## 2014-07-14 MED ORDER — ONDANSETRON HCL 4 MG/2ML IJ SOLN: 4.0000 mg | Freq: Four times a day (QID) | INTRAMUSCULAR | Status: DC | PRN

## 2014-07-14 MED ORDER — SODIUM CHLORIDE 0.9 % IV SOLN: 250.0000 mL | INTRAVENOUS | Status: DC

## 2014-07-14 MED ORDER — SODIUM CHLORIDE 0.9 % IJ SOLN
3.0000 mL | Freq: Two times a day (BID) | INTRAMUSCULAR | Status: DC
  Administered 2014-07-15 – 2014-07-19 (×9): 3 mL via INTRAVENOUS

## 2014-07-14 MED ORDER — METOPROLOL TARTRATE 25 MG/10 ML ORAL SUSPENSION
12.5000 mg | Freq: Two times a day (BID) | ORAL | Status: DC
  Administered 2014-07-16: 12.5 mg
  Filled 2014-07-14 (×5): qty 5

## 2014-07-14 MED ORDER — SUCCINYLCHOLINE CHLORIDE 20 MG/ML IJ SOLN
INTRAMUSCULAR | Status: AC
  Filled 2014-07-14: qty 1

## 2014-07-14 MED ORDER — ALBUMIN HUMAN 5 % IV SOLN
250.0000 mL | INTRAVENOUS | Status: AC | PRN
  Administered 2014-07-14 (×3): 250 mL via INTRAVENOUS
  Filled 2014-07-14: qty 250

## 2014-07-14 MED ORDER — ROCURONIUM BROMIDE 50 MG/5ML IV SOLN
INTRAVENOUS | Status: AC
  Filled 2014-07-14: qty 4

## 2014-07-14 MED ORDER — ACETAMINOPHEN 650 MG RE SUPP
650.0000 mg | Freq: Once | RECTAL | Status: AC
  Administered 2014-07-14: 650 mg via RECTAL

## 2014-07-14 MED ORDER — INSULIN REGULAR BOLUS VIA INFUSION
0.0000 [IU] | Freq: Three times a day (TID) | INTRAVENOUS | Status: DC
  Filled 2014-07-14: qty 10

## 2014-07-14 MED ORDER — SODIUM CHLORIDE 0.9 % IV SOLN: INTRAVENOUS | Status: DC

## 2014-07-14 MED ORDER — FENTANYL CITRATE (PF) 100 MCG/2ML IJ SOLN
100.0000 ug | Freq: Once | INTRAMUSCULAR | Status: AC
  Administered 2014-07-14: 100 ug via INTRAVENOUS

## 2014-07-14 MED ORDER — DEXTROSE 5 % IV SOLN
1.5000 g | Freq: Two times a day (BID) | INTRAVENOUS | Status: AC
  Administered 2014-07-15 – 2014-07-16 (×4): 1.5 g via INTRAVENOUS
  Filled 2014-07-14 (×4): qty 1.5

## 2014-07-14 MED ORDER — METOPROLOL TARTRATE 12.5 MG HALF TABLET
12.5000 mg | ORAL_TABLET | Freq: Two times a day (BID) | ORAL | Status: DC
  Administered 2014-07-15 (×2): 12.5 mg via ORAL
  Filled 2014-07-14 (×5): qty 1

## 2014-07-14 MED ORDER — MIDAZOLAM HCL 5 MG/5ML IJ SOLN
INTRAMUSCULAR | Status: DC | PRN
  Administered 2014-07-14: 2 mg via INTRAVENOUS
  Administered 2014-07-14: 5 mg via INTRAVENOUS
  Administered 2014-07-14: 3 mg via INTRAVENOUS
  Administered 2014-07-14: 2 mg via INTRAVENOUS
  Administered 2014-07-14: 5 mg via INTRAVENOUS
  Administered 2014-07-14: 3 mg via INTRAVENOUS

## 2014-07-14 MED ORDER — MIDAZOLAM HCL 10 MG/2ML IJ SOLN
INTRAMUSCULAR | Status: AC
  Filled 2014-07-14: qty 2

## 2014-07-14 MED ORDER — NITROGLYCERIN IN D5W 200-5 MCG/ML-% IV SOLN: 0.0000 ug/min | INTRAVENOUS | Status: DC

## 2014-07-14 MED ORDER — SODIUM CHLORIDE 0.9 % IJ SOLN
INTRAMUSCULAR | Status: AC
  Filled 2014-07-14: qty 10

## 2014-07-14 MED ORDER — MAGNESIUM SULFATE 4 GM/100ML IV SOLN
4.0000 g | Freq: Once | INTRAVENOUS | Status: AC
  Administered 2014-07-14: 4 g via INTRAVENOUS
  Filled 2014-07-14: qty 100

## 2014-07-14 MED ORDER — LIDOCAINE HCL (CARDIAC) 20 MG/ML IV SOLN
INTRAVENOUS | Status: DC | PRN
  Administered 2014-07-14: 75 mg via INTRAVENOUS

## 2014-07-14 MED ORDER — CETYLPYRIDINIUM CHLORIDE 0.05 % MT LIQD
7.0000 mL | Freq: Four times a day (QID) | OROMUCOSAL | Status: DC
  Administered 2014-07-15 – 2014-07-20 (×12): 7 mL via OROMUCOSAL

## 2014-07-14 MED ORDER — MORPHINE SULFATE 2 MG/ML IJ SOLN
2.0000 mg | INTRAMUSCULAR | Status: DC | PRN
  Administered 2014-07-15: 4 mg via INTRAVENOUS
  Administered 2014-07-15: 2 mg via INTRAVENOUS
  Administered 2014-07-15 (×2): 4 mg via INTRAVENOUS
  Administered 2014-07-16: 2 mg via INTRAVENOUS
  Filled 2014-07-14: qty 2
  Filled 2014-07-14: qty 1
  Filled 2014-07-14 (×2): qty 2
  Filled 2014-07-14: qty 1
  Filled 2014-07-14: qty 2

## 2014-07-14 MED ORDER — LACTATED RINGERS IV SOLN
INTRAVENOUS | Status: DC | PRN
  Administered 2014-07-14: 12:00:00 via INTRAVENOUS

## 2014-07-14 MED ORDER — CHLORHEXIDINE GLUCONATE 0.12 % MT SOLN
15.0000 mL | Freq: Two times a day (BID) | OROMUCOSAL | Status: DC
  Administered 2014-07-14 – 2014-07-19 (×12): 15 mL via OROMUCOSAL
  Filled 2014-07-14 (×15): qty 15

## 2014-07-14 MED ORDER — SODIUM CHLORIDE 0.9 % IJ SOLN
3.0000 mL | INTRAMUSCULAR | Status: DC | PRN
  Administered 2014-07-15: 3 mL via INTRAVENOUS
  Filled 2014-07-14: qty 3

## 2014-07-14 MED ORDER — ACETAMINOPHEN 160 MG/5ML PO SOLN: 650.0000 mg | Freq: Once | ORAL | Status: AC

## 2014-07-14 MED ORDER — HEPARIN SODIUM (PORCINE) 1000 UNIT/ML IJ SOLN
INTRAMUSCULAR | Status: DC | PRN
  Administered 2014-07-14: 43000 [IU] via INTRAVENOUS
  Administered 2014-07-14: 2000 [IU] via INTRAVENOUS

## 2014-07-14 MED ORDER — MIDAZOLAM HCL 5 MG/ML IJ SOLN: 2.0000 mg | Freq: Once | INTRAMUSCULAR | Status: AC

## 2014-07-14 MED ORDER — SODIUM CHLORIDE 0.9 % IV SOLN
INTRAVENOUS | Status: DC
  Filled 2014-07-14: qty 2.5

## 2014-07-14 MED ORDER — ASPIRIN EC 325 MG PO TBEC
325.0000 mg | DELAYED_RELEASE_TABLET | Freq: Every day | ORAL | Status: DC
  Administered 2014-07-15 – 2014-07-20 (×6): 325 mg via ORAL
  Filled 2014-07-14 (×6): qty 1

## 2014-07-14 MED ORDER — ASPIRIN 81 MG PO CHEW
324.0000 mg | CHEWABLE_TABLET | Freq: Every day | ORAL | Status: DC
  Filled 2014-07-14 (×2): qty 4

## 2014-07-14 MED ORDER — SODIUM CHLORIDE 0.9 % IJ SOLN
OROMUCOSAL | Status: DC | PRN
  Administered 2014-07-14 (×3): via TOPICAL

## 2014-07-14 MED ORDER — PANTOPRAZOLE SODIUM 40 MG PO TBEC
40.0000 mg | DELAYED_RELEASE_TABLET | Freq: Every day | ORAL | Status: DC
  Administered 2014-07-16 – 2014-07-19 (×4): 40 mg via ORAL
  Filled 2014-07-14 (×5): qty 1

## 2014-07-14 MED ORDER — EPHEDRINE SULFATE 50 MG/ML IJ SOLN
INTRAMUSCULAR | Status: AC
  Filled 2014-07-14: qty 1

## 2014-07-14 MED ORDER — LACTATED RINGERS IV SOLN: INTRAVENOUS | Status: DC

## 2014-07-14 MED ORDER — STERILE WATER FOR INJECTION IJ SOLN
INTRAMUSCULAR | Status: AC
  Filled 2014-07-14: qty 10

## 2014-07-14 MED ORDER — TRAMADOL HCL 50 MG PO TABS
50.0000 mg | ORAL_TABLET | ORAL | Status: DC | PRN
  Administered 2014-07-16 – 2014-07-19 (×7): 100 mg via ORAL
  Filled 2014-07-14 (×8): qty 2

## 2014-07-14 MED ORDER — LIDOCAINE HCL (CARDIAC) 20 MG/ML IV SOLN
INTRAVENOUS | Status: AC
  Filled 2014-07-14: qty 10

## 2014-07-14 MED ORDER — MIDAZOLAM HCL 2 MG/2ML IJ SOLN
2.0000 mg | INTRAMUSCULAR | Status: DC | PRN
  Administered 2014-07-14 (×2): 2 mg via INTRAVENOUS
  Filled 2014-07-14 (×3): qty 2

## 2014-07-14 MED ORDER — FENTANYL CITRATE (PF) 100 MCG/2ML IJ SOLN
INTRAMUSCULAR | Status: AC
  Administered 2014-07-14: 100 ug via INTRAVENOUS
  Filled 2014-07-14: qty 2

## 2014-07-14 MED ORDER — DOCUSATE SODIUM 100 MG PO CAPS
200.0000 mg | ORAL_CAPSULE | Freq: Every day | ORAL | Status: DC
  Administered 2014-07-15 – 2014-07-20 (×5): 200 mg via ORAL
  Filled 2014-07-14 (×7): qty 2

## 2014-07-14 MED ORDER — INSULIN ASPART 100 UNIT/ML ~~LOC~~ SOLN
0.0000 [IU] | SUBCUTANEOUS | Status: DC
  Administered 2014-07-15 (×4): 2 [IU] via SUBCUTANEOUS

## 2014-07-14 MED ORDER — FAMOTIDINE IN NACL 20-0.9 MG/50ML-% IV SOLN
20.0000 mg | Freq: Two times a day (BID) | INTRAVENOUS | Status: DC
  Administered 2014-07-14: 20 mg via INTRAVENOUS

## 2014-07-14 MED ORDER — HEMOSTATIC AGENTS (NO CHARGE) OPTIME
TOPICAL | Status: DC | PRN
  Administered 2014-07-14: 1 via TOPICAL

## 2014-07-14 MED ORDER — FENTANYL CITRATE (PF) 100 MCG/2ML IJ SOLN
INTRAMUSCULAR | Status: DC | PRN
  Administered 2014-07-14: 500 ug via INTRAVENOUS
  Administered 2014-07-14: 100 ug via INTRAVENOUS
  Administered 2014-07-14: 250 ug via INTRAVENOUS
  Administered 2014-07-14: 100 ug via INTRAVENOUS
  Administered 2014-07-14: 400 ug via INTRAVENOUS
  Administered 2014-07-14: 150 ug via INTRAVENOUS

## 2014-07-14 MED ORDER — ACETAMINOPHEN 160 MG/5ML PO SOLN
1000.0000 mg | Freq: Four times a day (QID) | ORAL | Status: AC
  Administered 2014-07-15: 1000 mg
  Filled 2014-07-14: qty 40.6

## 2014-07-14 MED ORDER — PROPOFOL 10 MG/ML IV BOLUS
INTRAVENOUS | Status: DC | PRN
  Administered 2014-07-14: 100 mg via INTRAVENOUS

## 2014-07-14 SURGICAL SUPPLY — 86 items
BAG DECANTER FOR FLEXI CONT (MISCELLANEOUS) ×4 IMPLANT
BANDAGE ELASTIC 4 VELCRO ST LF (GAUZE/BANDAGES/DRESSINGS) ×4 IMPLANT
BANDAGE ELASTIC 6 VELCRO ST LF (GAUZE/BANDAGES/DRESSINGS) ×4 IMPLANT
BASKET HEART  (ORDER IN 25'S) (MISCELLANEOUS) ×1
BASKET HEART (ORDER IN 25'S) (MISCELLANEOUS) ×1
BASKET HEART (ORDER IN 25S) (MISCELLANEOUS) ×2 IMPLANT
BLADE STERNUM SYSTEM 6 (BLADE) ×4 IMPLANT
BNDG GAUZE ELAST 4 BULKY (GAUZE/BANDAGES/DRESSINGS) ×4 IMPLANT
CANISTER SUCTION 2500CC (MISCELLANEOUS) ×4 IMPLANT
CANNULA EZ GLIDE 8.0 24FR (CANNULA) ×4 IMPLANT
CANNULA EZ GLIDE AORTIC 21FR (CANNULA) ×4 IMPLANT
CATH CPB KIT HENDRICKSON (MISCELLANEOUS) ×4 IMPLANT
CATH ROBINSON RED A/P 18FR (CATHETERS) ×4 IMPLANT
CATH THORACIC 36FR (CATHETERS) ×4 IMPLANT
CATH THORACIC 36FR RT ANG (CATHETERS) ×4 IMPLANT
CLIP FOGARTY SPRING 6M (CLIP) ×4 IMPLANT
CLIP TI MEDIUM 24 (CLIP) IMPLANT
CLIP TI WIDE RED SMALL 24 (CLIP) ×12 IMPLANT
CRADLE DONUT ADULT HEAD (MISCELLANEOUS) ×4 IMPLANT
DRAPE CARDIOVASCULAR INCISE (DRAPES) ×2
DRAPE SLUSH/WARMER DISC (DRAPES) ×4 IMPLANT
DRAPE SRG 135X102X78XABS (DRAPES) ×2 IMPLANT
DRSG COVADERM 4X14 (GAUZE/BANDAGES/DRESSINGS) ×4 IMPLANT
ELECT REM PT RETURN 9FT ADLT (ELECTROSURGICAL) ×8
ELECTRODE REM PT RTRN 9FT ADLT (ELECTROSURGICAL) ×4 IMPLANT
GAUZE SPONGE 4X4 12PLY STRL (GAUZE/BANDAGES/DRESSINGS) ×8 IMPLANT
GLOVE BIO SURGEON STRL SZ 6 (GLOVE) ×4 IMPLANT
GLOVE SURG SIGNA 7.5 PF LTX (GLOVE) ×12 IMPLANT
GOWN STRL REUS W/ TWL LRG LVL3 (GOWN DISPOSABLE) ×8 IMPLANT
GOWN STRL REUS W/ TWL XL LVL3 (GOWN DISPOSABLE) ×4 IMPLANT
GOWN STRL REUS W/TWL LRG LVL3 (GOWN DISPOSABLE) ×8
GOWN STRL REUS W/TWL XL LVL3 (GOWN DISPOSABLE) ×4
HEMOSTAT POWDER SURGIFOAM 1G (HEMOSTASIS) ×12 IMPLANT
HEMOSTAT SURGICEL 2X14 (HEMOSTASIS) ×4 IMPLANT
INSERT FOGARTY 61MM (MISCELLANEOUS) ×4 IMPLANT
INSERT FOGARTY XLG (MISCELLANEOUS) IMPLANT
KIT BASIN OR (CUSTOM PROCEDURE TRAY) ×4 IMPLANT
KIT CATH SUCT 8FR (CATHETERS) ×4 IMPLANT
KIT ROOM TURNOVER OR (KITS) ×4 IMPLANT
KIT SUCTION CATH 14FR (SUCTIONS) ×8 IMPLANT
KIT VASOVIEW W/TROCAR VH 2000 (KITS) ×4 IMPLANT
MARKER GRAFT CORONARY BYPASS (MISCELLANEOUS) ×12 IMPLANT
NS IRRIG 1000ML POUR BTL (IV SOLUTION) ×20 IMPLANT
PACK OPEN HEART (CUSTOM PROCEDURE TRAY) ×4 IMPLANT
PAD ARMBOARD 7.5X6 YLW CONV (MISCELLANEOUS) ×8 IMPLANT
PAD ELECT DEFIB RADIOL ZOLL (MISCELLANEOUS) ×4 IMPLANT
PENCIL BUTTON HOLSTER BLD 10FT (ELECTRODE) ×4 IMPLANT
PUNCH AORTIC ROTATE  4.5MM 8IN (MISCELLANEOUS) ×4 IMPLANT
PUNCH AORTIC ROTATE 4.0MM (MISCELLANEOUS) IMPLANT
PUNCH AORTIC ROTATE 4.5MM 8IN (MISCELLANEOUS) IMPLANT
PUNCH AORTIC ROTATE 5MM 8IN (MISCELLANEOUS) IMPLANT
SET CARDIOPLEGIA MPS 5001102 (MISCELLANEOUS) ×4 IMPLANT
SUT BONE WAX W31G (SUTURE) ×4 IMPLANT
SUT MNCRL AB 4-0 PS2 18 (SUTURE) IMPLANT
SUT PROLENE 3 0 SH DA (SUTURE) ×4 IMPLANT
SUT PROLENE 4 0 RB 1 (SUTURE) ×4
SUT PROLENE 4 0 SH DA (SUTURE) IMPLANT
SUT PROLENE 4-0 RB1 .5 CRCL 36 (SUTURE) ×4 IMPLANT
SUT PROLENE 5 0 C 1 36 (SUTURE) ×4 IMPLANT
SUT PROLENE 6 0 C 1 30 (SUTURE) ×16 IMPLANT
SUT PROLENE 7 0 BV1 MDA (SUTURE) ×8 IMPLANT
SUT PROLENE 8 0 BV175 6 (SUTURE) ×8 IMPLANT
SUT SILK  1 MH (SUTURE) ×2
SUT SILK 1 MH (SUTURE) ×2 IMPLANT
SUT STEEL 6MS V (SUTURE) ×4 IMPLANT
SUT STEEL STERNAL CCS#1 18IN (SUTURE) IMPLANT
SUT STEEL SZ 6 DBL 3X14 BALL (SUTURE) ×4 IMPLANT
SUT VIC AB 1 CTX 36 (SUTURE) ×4
SUT VIC AB 1 CTX36XBRD ANBCTR (SUTURE) ×4 IMPLANT
SUT VIC AB 2-0 CT1 27 (SUTURE) ×2
SUT VIC AB 2-0 CT1 TAPERPNT 27 (SUTURE) ×2 IMPLANT
SUT VIC AB 2-0 CTX 27 (SUTURE) IMPLANT
SUT VIC AB 3-0 SH 27 (SUTURE) ×2
SUT VIC AB 3-0 SH 27X BRD (SUTURE) ×2 IMPLANT
SUT VIC AB 3-0 X1 27 (SUTURE) ×4 IMPLANT
SUT VICRYL 4-0 PS2 18IN ABS (SUTURE) IMPLANT
SUTURE E-PAK OPEN HEART (SUTURE) ×4 IMPLANT
SYSTEM SAHARA CHEST DRAIN ATS (WOUND CARE) ×4 IMPLANT
TAPE CLOTH SURG 4X10 WHT LF (GAUZE/BANDAGES/DRESSINGS) ×4 IMPLANT
TOWEL OR 17X24 6PK STRL BLUE (TOWEL DISPOSABLE) ×8 IMPLANT
TOWEL OR 17X26 10 PK STRL BLUE (TOWEL DISPOSABLE) ×8 IMPLANT
TRAY FOLEY IC TEMP SENS 16FR (CATHETERS) ×4 IMPLANT
TUBE FEEDING 8FR 16IN STR KANG (MISCELLANEOUS) ×4 IMPLANT
TUBING INSUFFLATION (TUBING) ×4 IMPLANT
UNDERPAD 30X30 INCONTINENT (UNDERPADS AND DIAPERS) ×4 IMPLANT
WATER STERILE IRR 1000ML POUR (IV SOLUTION) ×8 IMPLANT

## 2014-07-14 NOTE — Progress Notes (Signed)
CT surgery p.m. Rounds  Patient stable after multivessel CABG with bilateral IMA Hemodynamics stable, chest tubes with minimal output Continue postop care and wean ventilator per protocol

## 2014-07-14 NOTE — OR Nursing (Signed)
Second call to SICU charge nurse at 1759.

## 2014-07-14 NOTE — Anesthesia Preprocedure Evaluation (Signed)
Anesthesia Evaluation    History of Anesthesia Complications Negative for: history of anesthetic complications  Airway        Dental   Pulmonary Current Smoker,          Cardiovascular hypertension, + CAD and + Past MI  Left Ventricular Angiogram: LVEF=60-65%.   Impression: 1. Triple vessel CAD 2. NSTEMI 3. Normal LV systolic function   Neuro/Psych negative neurological ROS  negative psych ROS   GI/Hepatic negative GI ROS, Neg liver ROS,   Endo/Other  negative endocrine ROS  Renal/GU negative Renal ROS     Musculoskeletal   Abdominal   Peds  Hematology   Anesthesia Other Findings   Reproductive/Obstetrics                             Anesthesia Physical Anesthesia Plan  ASA: IV  Anesthesia Plan: General   Post-op Pain Management:    Induction: Intravenous  Airway Management Planned: Oral ETT  Additional Equipment: Arterial line, 3D TEE, PA Cath and Ultrasound Guidance Line Placement  Intra-op Plan:   Post-operative Plan: Post-operative intubation/ventilation  Informed Consent:   Plan Discussed with:   Anesthesia Plan Comments:         Anesthesia Quick Evaluation

## 2014-07-14 NOTE — Progress Notes (Signed)
Patient woke up while oral care being performed. Began thrashing his head and entire body around on bed. Explained to him that he needed to calm down, that he was in ICU after surgery, and that he had the breathing tube in to help him breath. He continued to thrash and mouth "I can't breath." Attempted again to explain that he needed to calm down so we could work on weaning. 2 RN and tech holding patient in place to keep from dislodging pacer, ETT, chest tubes, and lines. PRN medications administered. Patient asleep post medication. ETT holder placed. Will continue to attempt to wake up and wean if, and when, appropriate. Thresa RossA. Bodi Palmeri RN

## 2014-07-14 NOTE — Progress Notes (Signed)
  Echocardiogram 2D Echocardiogram has been performed.  Benjamin King, Benjamin King 07/14/2014, 1:18 PM

## 2014-07-14 NOTE — Transfer of Care (Signed)
Immediate Anesthesia Transfer of Care Note  Patient: Benjamin King  Procedure(s) Performed: Procedure(s) with comments: CORONARY ARTERY BYPASS GRAFTING (CABG), ON PUMP, TIMES FIVE, USING BILATERAL MAMMARY ARTERIES, RIGHT GREATER SAPHENOUS VEIN HARVESTED ENDOSCOPICALLY (N/A) - Bilateral Mammary Arteries TRANSESOPHAGEAL ECHOCARDIOGRAM (TEE) (N/A)  Patient Location: SICU  Anesthesia Type:General  Level of Consciousness: sedated and Patient remains intubated per anesthesia plan  Airway & Oxygen Therapy: Patient remains intubated per anesthesia plan and Patient placed on Ventilator (see vital sign flow sheet for setting)  Post-op Assessment: Report given to RN and Post -op Vital signs reviewed and stable  Post vital signs: Reviewed and stable  Last Vitals:  Filed Vitals:   07/14/14 1150  BP:   Pulse: 66  Temp:   Resp: 11    Complications: No apparent anesthesia complications

## 2014-07-14 NOTE — Anesthesia Procedure Notes (Addendum)
Procedure Name: Intubation Date/Time: 07/14/2014 12:45 PM Performed by: Eligha Bridegroom Pre-anesthesia Checklist: Patient identified, Timeout performed, Emergency Drugs available, Suction available and Patient being monitored Patient Re-evaluated:Patient Re-evaluated prior to inductionOxygen Delivery Method: Circle system utilized Preoxygenation: Pre-oxygenation with 100% oxygen Intubation Type: IV induction Ventilation: Mask ventilation without difficulty and Oral airway inserted - appropriate to patient size Laryngoscope Size: Mac and 4 Grade View: Grade II Tube type: Oral Tube size: 8.5 mm Number of attempts: 1 Airway Equipment and Method: Stylet and LTA kit utilized Placement Confirmation: ETT inserted through vocal cords under direct vision,  breath sounds checked- equal and bilateral and positive ETCO2 Secured at: 23 cm Tube secured with: Tape Dental Injury: Teeth and Oropharynx as per pre-operative assessment

## 2014-07-14 NOTE — H&P (View-Only) (Signed)
Reason for Consult:3 vessel CAD Referring Physician: Dr. [place]  Benjamin King is an 54 y.o. male.  HPI: 54 yo man presented with a cc/o chest pain.  Benjamin King is a 54 yo man with no prior history of CAD. He does have multiple CRF including hypertension, hyperlipidemia, light tobacco abuse and a strong family history of premature CAD. He was in his usual state of health until about a week ago. He was making a delivery when he experienced severe fatigue and shortness of breath. He felt a pressure sensation in his chest. He rested a few minutes and it went away.  Then early Friday morning he was awakened from sleep with severe CP, "like someone was sitting on my chest," and shortness of breath. The pain eased, but when he got up it came back even worse. He went to [place]. His ECG was unremarkable but his troponin was positive at 0.23. He was started on heparin. He has been pain free since then. 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   Past Medical History  Diagnosis Date  . HTN (hypertension)   . Hyperlipidemia     Past Surgical History  Procedure Laterality Date  . Right knee surgery      trauma  . Left heart catheterization with coronary angiogram N/A 07/10/2014    Procedure: LEFT HEART CATHETERIZATION WITH CORONARY ANGIOGRAM;  Surgeon: Jettie Booze, MD;  Location: [facility] CATH LAB;  Service: Cardiovascular;  Laterality: N/A;    FAMILY HISTORY- + premature CAD- Mother had CABG  Social History:  reports that he has been smoking Cigars.  He does not have any smokeless tobacco history on file. His alcohol and drug histories are not on file.  Allergies: No Known Allergies  Medications:  Scheduled: . amLODipine  10 mg Oral Daily  . aspirin EC  81 mg Oral Daily  . lisinopril  2.5 mg Oral Daily  . metoprolol tartrate  25 mg Oral BID  . simvastatin  20 mg Oral q1800    Results for orders placed or performed  during the hospital encounter of 07/09/14 (from the past 48 hour(s))  CBC     Status: Abnormal   Collection Time: 07/10/14  3:22 AM  Result Value Ref Range   WBC 9.4 4.0 - 10.5 K/uL   RBC 4.11 (L) 4.22 - 5.81 MIL/uL   Hemoglobin 12.3 (L) 13.0 - 17.0 g/dL   HCT 37.2 (L) 39.0 - 52.0 %   MCV 90.5 78.0 - 100.0 fL   MCH 29.9 26.0 - 34.0 pg   MCHC 33.1 30.0 - 36.0 g/dL   RDW 13.2 11.5 - 15.5 %   Platelets 236 150 - 400 K/uL  Basic metabolic panel     Status: Abnormal   Collection Time: 07/10/14  3:22 AM  Result Value Ref Range   Sodium 140 135 - 145 mmol/L   Potassium 3.6 3.5 - 5.1 mmol/L   Chloride 104 96 - 112 mmol/L   CO2 26 19 - 32 mmol/L   Glucose, Bld 106 (H) 70 - 99 mg/dL   BUN <5 (L) 6 - 23 mg/dL   Creatinine, Ser 0.72 0.50 - 1.35 mg/dL   Calcium 8.6 8.4 - 10.5 mg/dL   GFR calc non Af Amer >90 >90 mL/min   GFR calc Af Amer >90 >90 mL/min    Comment: (NOTE) The eGFR has been calculated using the CKD EPI equation. This calculation has not been validated  in all clinical situations. eGFR's persistently <90 mL/min signify possible Chronic Kidney Disease.    Anion gap 10 5 - 15  Heparin level (unfractionated)     Status: None   Collection Time: 07/10/14  3:22 AM  Result Value Ref Range   Heparin Unfractionated 0.48 0.30 - 0.70 IU/mL    Comment:        IF HEPARIN RESULTS ARE BELOW EXPECTED VALUES, AND PATIENT DOSAGE HAS BEEN CONFIRMED, SUGGEST FOLLOW UP TESTING OF ANTITHROMBIN III LEVELS.   Protime-INR     Status: None   Collection Time: 07/10/14  3:22 AM  Result Value Ref Range   Prothrombin Time 14.2 11.6 - 15.2 seconds   INR 1.09 0.00 - 1.49  Lipid panel     Status: Abnormal   Collection Time: 07/10/14  3:22 AM  Result Value Ref Range   Cholesterol 117 0 - 200 mg/dL   Triglycerides 190 (H) <150 mg/dL   HDL 26 (L) >39 mg/dL   Total CHOL/HDL Ratio 4.5 RATIO   VLDL 38 0 - 40 mg/dL   LDL Cholesterol 53 0 - 99 mg/dL    Comment:        Total Cholesterol/HDL:CHD  Risk Coronary Heart Disease Risk Table                     Men   Women  1/2 Average Risk   3.4   3.3  Average Risk       5.0   4.4  2 X Average Risk   9.6   7.1  3 X Average Risk  23.4   11.0        Use the calculated Patient Ratio above and the CHD Risk Table to determine the patient's CHD Risk.        ATP III CLASSIFICATION (LDL):  <100     mg/dL   Optimal  100-129  mg/dL   Near or Above                    Optimal  130-159  mg/dL   Borderline  160-189  mg/dL   High  >190     mg/dL   Very High   CBC     Status: Abnormal   Collection Time: 07/11/14  5:41 AM  Result Value Ref Range   WBC 8.4 4.0 - 10.5 K/uL   RBC 4.19 (L) 4.22 - 5.81 MIL/uL   Hemoglobin 12.8 (L) 13.0 - 17.0 g/dL   HCT 37.7 (L) 39.0 - 52.0 %   MCV 90.0 78.0 - 100.0 fL   MCH 30.5 26.0 - 34.0 pg   MCHC 34.0 30.0 - 36.0 g/dL   RDW 13.3 11.5 - 15.5 %   Platelets 252 150 - 400 K/uL  Heparin level (unfractionated)     Status: None   Collection Time: 07/11/14  1:25 PM  Result Value Ref Range   Heparin Unfractionated 0.45 0.30 - 0.70 IU/mL    Comment:        IF HEPARIN RESULTS ARE BELOW EXPECTED VALUES, AND PATIENT DOSAGE HAS BEEN CONFIRMED, SUGGEST FOLLOW UP TESTING OF ANTITHROMBIN III LEVELS.     No results found.  Review of Systems  Constitutional: Positive for weight loss (has lost 50 pounds through diet and exercise).  Respiratory: Positive for shortness of breath.   Cardiovascular: Positive for chest pain and orthopnea.  All other systems reviewed and are negative.  Blood pressure 122/68, pulse 93, temperature 99.2 F (37.3 C),  temperature source Oral, resp. rate 18, height 6' 3" (1.905 m), weight 277 lb (125.646 kg), SpO2 99 %. Physical Exam  Vitals reviewed. Constitutional: He is oriented to person, place, and time. He appears well-developed and well-nourished. No distress.  HENT:  Head: Normocephalic and atraumatic.  Eyes: EOM are normal. Pupils are equal, round, and reactive to light.   Neck: Neck supple. No thyromegaly present.  Cardiovascular: Normal rate, regular rhythm, normal heart sounds and intact distal pulses.   No murmur heard. + right carotid bruit  Respiratory: Effort normal and breath sounds normal. He has no wheezes.  GI: Soft. There is no tenderness.  Musculoskeletal: He exhibits no edema.  Lymphadenopathy:    He has no cervical adenopathy.  Neurological: He is alert and oriented to person, place, and time. No cranial nerve deficit.  No motor deficit  Skin: Skin is warm and dry.    Assessment/Plan: 54 yo man with multiple CRF presented with a NSTEMI. At catheterization he has severe 3 vessel CAD with preserved LV function.  CABG is indicated for survival benefit and relief of symptoms.  I discussed the general nature of the procedure, the need for general anesthesia, the use of cardiopulmonary bypass, and the incisions to be used with Mr and Mrs Lofgren. I discussed the expected hospital stay, overall recovery and short and long term outcomes. He understands CABG is palliative and not curative. I reviewed the indications, risks, benefits and alternatives. They understand the risks include, but are not limited to death, stroke, MI, DVT/PE, bleeding, possible need for transfusion, infections, early/ late graft occlusion, cardiac arrhythmias, and other organ system dysfunction including respiratory, renal, or GI complications.   He accepts the risks and agrees to proceed.  Will plan to use bilateral IMA due to young age.  First available OR presently is Friday AM. Melrose Nakayama 07/11/2014, 5:58 PM

## 2014-07-14 NOTE — Interval H&P Note (Signed)
History and Physical Interval Note:  07/14/2014 12:10 PM  Benjamin King  has presented today for surgery, with the diagnosis of CAD  The various methods of treatment have been discussed with the patient and family. After consideration of risks, benefits and other options for treatment, the patient has consented to  Procedure(s) with comments: CORONARY ARTERY BYPASS GRAFTING (CABG) (N/A) - Bilateral Mammary Arteries TRANSESOPHAGEAL ECHOCARDIOGRAM (TEE) (N/A) as a surgical intervention .  The patient's history has been reviewed, patient examined, no change in status, stable for surgery.  I have reviewed the patient's chart and labs.  Questions were answered to the patient's satisfaction.     Loreli SlotSteven C Nataleah Scioneaux

## 2014-07-14 NOTE — Progress Notes (Signed)
Report called to OR RN. afleming RN

## 2014-07-14 NOTE — OR Nursing (Signed)
First call to SICU charge nurse at 1733.

## 2014-07-14 NOTE — Brief Op Note (Addendum)
07/09/2014 - 07/14/2014      [address]       [place] [id number]             [phone]     07/09/2014 - 07/14/2014  4:41 PM  PATIENT:  Benjamin King  54 y.o. male  PRE-OPERATIVE DIAGNOSIS:  SEVERE 3 VESSEL CAD, S/P NSTEMI  POST-OPERATIVE DIAGNOSIS: SEVERE 3 VESSEL CAD, S/P NSTEMI  PROCEDURE:  Procedure(s): CORONARY ARTERY BYPASS GRAFTING (CABG) X 5   LIMA to LAD  FREE RIMA to ANTEROLATERAL DIAGONAL  SEQ SVG to PDA and PL  SVG to OM2 TRANSESOPHAGEAL ECHOCARDIOGRAM (TEE) EVH RIGHT THIGH GREATER SAPHENOUS VEIN  SURGEON:  Surgeon(s): Loreli SlotSteven C Dimitriy Carreras, MD  PHYSICIAN ASSISTANT: WAYNE GOLD PA-C  ANESTHESIA:   general  PATIENT CONDITION:  ICU - intubated and hemodynamically stable.  PRE-OPERATIVE WEIGHT: 123kg  EBL: SEE ANEST/PERFUSION RECORDS  COMPLICATIONS: NO KNOWN  XC= 82 min CPB= 136 min  Cardiomegaly Fair quality targets Good quality conduits RIMA would not reach as pedicle graft, therefore used as free graft with proximal off OM saphenous vein  Benjamin SpareSteven C. Dorris FetchHendrickson, MD [facility] [phone]

## 2014-07-15 ENCOUNTER — Inpatient Hospital Stay (HOSPITAL_COMMUNITY): Payer: 59

## 2014-07-15 LAB — CBC
HCT: 28.4 % — ABNORMAL LOW (ref 39.0–52.0)
HEMATOCRIT: 31.6 % — AB (ref 39.0–52.0)
HEMOGLOBIN: 9.7 g/dL — AB (ref 13.0–17.0)
HEMOGLOBIN: 10.9 g/dL — AB (ref 13.0–17.0)
MCH: 30.3 pg (ref 26.0–34.0)
MCH: 30.7 pg (ref 26.0–34.0)
MCHC: 34.2 g/dL (ref 30.0–36.0)
MCHC: 34.5 g/dL (ref 30.0–36.0)
MCV: 88.8 fL (ref 78.0–100.0)
MCV: 89 fL (ref 78.0–100.0)
PLATELETS: 122 10*3/uL — AB (ref 150–400)
Platelets: 171 10*3/uL (ref 150–400)
RBC: 3.2 MIL/uL — AB (ref 4.22–5.81)
RBC: 3.55 MIL/uL — AB (ref 4.22–5.81)
RDW: 13.2 % (ref 11.5–15.5)
RDW: 13.4 % (ref 11.5–15.5)
WBC: 9.1 10*3/uL (ref 4.0–10.5)
WBC: 13.8 10*3/uL — AB (ref 4.0–10.5)

## 2014-07-15 LAB — POCT I-STAT 3, ART BLOOD GAS (G3+)
Acid-base deficit: 3 mmol/L — ABNORMAL HIGH (ref 0.0–2.0)
Acid-base deficit: 4 mmol/L — ABNORMAL HIGH (ref 0.0–2.0)
Acid-base deficit: 4 mmol/L — ABNORMAL HIGH (ref 0.0–2.0)
Bicarbonate: 23.1 mEq/L (ref 20.0–24.0)
Bicarbonate: 21.3 mEq/L (ref 20.0–24.0)
Bicarbonate: 20.8 mEq/L (ref 20.0–24.0)
O2 SAT: 99 %
O2 SAT: 96 %
O2 Saturation: 99 %
PO2 ART: 129 mmHg — AB (ref 80.0–100.0)
PO2 ART: 86 mmHg (ref 80.0–100.0)
Patient temperature: 35.9
Patient temperature: 37.6
TCO2: 24 mmol/L (ref 0–100)
TCO2: 22 mmol/L (ref 0–100)
TCO2: 22 mmol/L (ref 0–100)
pCO2 arterial: 42.8 mmHg (ref 35.0–45.0)
pCO2 arterial: 38 mmHg (ref 35.0–45.0)
pCO2 arterial: 34.9 mmHg — ABNORMAL LOW (ref 35.0–45.0)
pH, Arterial: 7.334 — ABNORMAL LOW (ref 7.350–7.450)
pH, Arterial: 7.359 (ref 7.350–7.450)
pH, Arterial: 7.385 (ref 7.350–7.450)
pO2, Arterial: 127 mmHg — ABNORMAL HIGH (ref 80.0–100.0)

## 2014-07-15 LAB — BASIC METABOLIC PANEL
Anion gap: 7 (ref 5–15)
BUN: 7 mg/dL (ref 6–23)
CALCIUM: 7.9 mg/dL — AB (ref 8.4–10.5)
CHLORIDE: 110 mmol/L (ref 96–112)
CO2: 22 mmol/L (ref 19–32)
CREATININE: 0.73 mg/dL (ref 0.50–1.35)
GFR calc Af Amer: >90 mL/min (ref >90)
GFR calc non Af Amer: >90 mL/min (ref >90)
Glucose, Bld: 143 mg/dL — ABNORMAL HIGH (ref 70–99)
Potassium: 4.3 mmol/L (ref 3.5–5.1)
Sodium: 139 mmol/L (ref 135–145)

## 2014-07-15 LAB — GLUCOSE, CAPILLARY
GLUCOSE-CAPILLARY: 103 mg/dL — AB (ref 70–99)
GLUCOSE-CAPILLARY: 108 mg/dL — AB (ref 70–99)
GLUCOSE-CAPILLARY: 122 mg/dL — AB (ref 70–99)
GLUCOSE-CAPILLARY: 123 mg/dL — AB (ref 70–99)
GLUCOSE-CAPILLARY: 126 mg/dL — AB (ref 70–99)
GLUCOSE-CAPILLARY: 137 mg/dL — AB (ref 70–99)
Glucose-Capillary: 98 mg/dL (ref 70–99)
Glucose-Capillary: 99 mg/dL (ref 70–99)
Glucose-Capillary: 133 mg/dL — ABNORMAL HIGH (ref 70–99)

## 2014-07-15 LAB — POCT I-STAT 4, (NA,K, GLUC, HGB,HCT)
GLUCOSE: 113 mg/dL — AB (ref 70–99)
HEMATOCRIT: 33 % — AB (ref 39.0–52.0)
HEMOGLOBIN: 11.2 g/dL — AB (ref 13.0–17.0)
POTASSIUM: 4.3 mmol/L (ref 3.5–5.1)
SODIUM: 141 mmol/L (ref 135–145)

## 2014-07-15 LAB — CREATININE, SERUM
Creatinine, Ser: 0.74 mg/dL (ref 0.50–1.35)
GFR calc Af Amer: >90 mL/min (ref >90)

## 2014-07-15 LAB — POCT I-STAT, CHEM 8
BUN: 6 mg/dL (ref 6–23)
CHLORIDE: 101 mmol/L (ref 96–112)
Calcium, Ion: 1.17 mmol/L (ref 1.12–1.23)
Creatinine, Ser: 0.6 mg/dL (ref 0.50–1.35)
GLUCOSE: 141 mg/dL — AB (ref 70–99)
HCT: 34 % — ABNORMAL LOW (ref 39.0–52.0)
Hemoglobin: 11.6 g/dL — ABNORMAL LOW (ref 13.0–17.0)
POTASSIUM: 3.8 mmol/L (ref 3.5–5.1)
Sodium: 136 mmol/L (ref 135–145)
TCO2: 19 mmol/L (ref 0–100)

## 2014-07-15 LAB — MAGNESIUM
MAGNESIUM: 2.3 mg/dL (ref 1.5–2.5)
MAGNESIUM: 2.1 mg/dL (ref 1.5–2.5)

## 2014-07-15 MED ORDER — DEXTROSE-NACL 5-0.2 % IV SOLN: INTRAVENOUS | Status: DC

## 2014-07-15 MED ORDER — INSULIN ASPART 100 UNIT/ML ~~LOC~~ SOLN
0.0000 [IU] | SUBCUTANEOUS | Status: DC
  Administered 2014-07-15 – 2014-07-16 (×4): 2 [IU] via SUBCUTANEOUS

## 2014-07-15 MED ORDER — INSULIN DETEMIR 100 UNIT/ML ~~LOC~~ SOLN
8.0000 [IU] | Freq: Two times a day (BID) | SUBCUTANEOUS | Status: DC
  Administered 2014-07-15 – 2014-07-16 (×3): 8 [IU] via SUBCUTANEOUS
  Filled 2014-07-15 (×4): qty 0.08

## 2014-07-15 NOTE — Op Note (Signed)
[name]:  Harper, Tabitha                  ACCOUNT NO.:  [id number]  MEDICAL RECORD NO.:  [id number]  LOCATION:  2S05C                        FACILITY:  [facility]  PHYSICIAN:  Salvatore DecentSteven C. Dorris FetchHendrickson, M.D.DATE OF BIRTH:  Feb 19, 1961  DATE OF PROCEDURE:  07/14/2014 DATE OF DISCHARGE:                              OPERATIVE REPORT   PREOPERATIVE DIAGNOSIS:  Severe three-vessel disease, status post non-ST elevation myocardial infarction.  POSTOPERATIVE DIAGNOSIS:  Severe three-vessel disease, status post non- ST elevation myocardial infarction.  PROCEDURE:   Median sternotomy, extracorporeal circulation Coronary artery bypass grafting x5 with bilateral mammary arteries  LIMA to LAD,  Free right internal mammary artery to anterolateral diagonal branch,  Ssaphenous vein graft to obtuse marginal 2,  Sequential saphenous vein graft to posterior descending and   posterolateral branches of the right coronary artery Endoscopic vein harvest right thigh.  SURGEON:  Salvatore DecentSteven C. Dorris FetchHendrickson, M.D.  ASSISTANT:  Rowe ClackWayne E. Gold, PA-C.  ANESTHESIA:  General.  FINDINGS:  Transesophageal echocardiography showed preserved left ventricular function with no significant valvular pathology.  There was left ventricular hypertrophy.   Cardiomegaly.  Fair quality targets. Good quality conduits.  CLINICAL NOTE:  [name] is a 54 year old gentleman with multiple cardiac risk factors, but no prior history of coronary disease.  He was in his usual state of health until about a week prior to admission when he experienced severe fatigue and shortness of breath.  A few days later, he had a more severe episode and was awakened from sleep with severe chest pain.  [facility] His EKG was unremarkable, but troponins were positive.  He was started on heparin and [facility] where he had cardiac Catheterization. It revealed severe three-vessel coronary disease. He was advised to  undergo coronary artery bypass grafting.  The indications, risks, benefits, and alternatives were discussed in detail with the patient.  He understood and accepted the risks and agreed to proceed.  OPERATIVE NOTE:  [name] was brought to the preoperative holding area on July 14, 2014. The Anesthesia Service placed a Swan-Ganz catheter and an arterial blood pressure monitoring line.  He was taken to the operating room, anesthetized, and intubated.  A Foley catheter was placed.  Transesophageal echocardiography was performed.  It revealed normal left ventricular function with no significant valvular pathology.  There was left ventricular hypertrophy.  The chest, abdomen, and legs were prepped and draped in usual sterile fashion.  An incision was made in the medial aspect of the right leg just below the knee.  The greater saphenous vein was identified and was harvested endoscopically from the groin.  It was a good quality vessel. Simultaneously with the vein harvest, a median sternotomy was performed. Hemostasis was achieved.  The left internal mammary artery was harvested under direct vision. 2000 units of heparin was administered during the vessel harvest.  The left mammary was a good quality vessel with excellent flow when divided distally.  Next, the right internal mammary artery was harvested in similar fashion.  It also was a good quality vessel with excellent flow when divided distally.  The remainder of the full heparin dose was given.  The sternal retractor was placed.  The pericardium was opened.  The ascending aorta was inspected.  It was of normal size.  There was no evidence of atherosclerotic disease.  The aorta was cannulated via concentric 2-0 Ethibond pledgeted pursestring sutures.  A dual-stage venous cannula was placed via a pursestring suture in the right atrial appendage.  Cardiopulmonary bypass was instituted and flows were maintained per protocol.  The patient  was cooled to 32 degrees Celsius. The coronary arteries were inspected and anastomotic sites were chosen. The conduits were inspected and prepared.  The right mammary would not reach the LAD as a pedicle graft; therefore, the decision was made to divide it proximally and use as a free graft to the diagonal.  The proximal stump was suture ligated with a 2-0 silk suture.  A foam pad was placed in the pericardium to insufflate the heart.  A temperature probe was placed in the myocardial septum and a cardioplegia cannula was placed in the ascending aorta.  The aorta was crossclamped.  The left ventricle was emptied via the aortic root vent.  Cardiac arrest then was achieved with a combination of cold antegrade blood cardioplegia and topical iced saline.  1.5 L of cardioplegia was given.  There was a rapid diastolic arrest.  Septal cooling was slow to occur as expected given the cardiomegaly.  A reversed saphenous vein graft was placed sequentially to the posterior descending and posterolateral branches of the right coronary.  The posterior descending did have some plaque just proximal to the anastomosis, but the probe passed easily distally, the posterolateral also accepted the probe easily distally.  A side-to-side anastomosis was performed to the posterior descending and end-to-side to the posterolateral, both were done with running 7-0 Prolene sutures.  At the completion of the anastomosis, cardioplegia was administered.  There was good flow through the graft and good hemostasis at the anastomoses.  Next, a reversed saphenous vein graft was placed end-to-side to the distal obtuse marginal branch which had a tight 99% stenosis.  This was a smaller branch.  It was the smallest of the target vessels.  It did accept a 1.5 mm probe for a short distance distally.  The vein was anastomosed end-to-side with a running 7-0 Prolene suture.  With cardioplegia administration, there was satisfactory  flow and good hemostasis.  Additional cardioplegia was administered down the vein grafts as well as the aortic root.  The heart was elevated exposing the anterolateral wall and the large anterolateral diagonal branch, which was a ramus intermedius equivalent was identified.  The larger of the branches was dissected out and the distal end of the right mammary was beveled.  It was anastomosed end-to-side to the diagonal with a running 8-0 Prolene suture.  Cardioplegia was administered.  There was good backbleeding through the right mammary, but it was clear that this vessel would not reach the aorta for direct proximal anastomosis, so the plan was to take it as a Y-graft off the OM saphenous vein.  The left internal mammary artery was brought through a window in the pericardium.  The distal end was beveled.  It was then anastomosed end- to-side to the distal LAD.  The distal LAD accepted a 1.5 mm probe proximally and distally until just before the apex when the vessel narrowed.  The left mammary was an excellent quality vessel. It was 2 mm in diameter.  It was anastomosed end-to-side to the LAD with a running 8- 0 Prolene suture.  At completion of  the anastomosis, the bulldog clamp was briefly removed to inspect for hemostasis.  Rapid septal rewarming was noted.  The bulldog clamp was replaced and the mammary pedicle was tacked to the epicardial surface of the heart with 6-0 Prolene sutures.  After giving additional cardioplegia, the vein grafts were cut to length.  The proximal vein graft anastomoses were performed to 4.5 mm punch aortotomies with running 6-0 Prolene sutures.  At the completion of the final vein graft proximal the patient was placed in Trendelenburg position.  Lidocaine was administered.  The bulldog clamp was removed from the left mammary artery.  Rapid septal rewarming was again noted. The aortic root was de-aired and the aortic crossclamp was removed.  The total  crossclamp time was 82 minutes.  While rewarming was completed, bulldog clamps were placed proximally and distally on the OM graft and a longitudinal venotomy was made.  The proximal end of the right mammary was beveled and anastomosed end-to- side to the OM vein with a running 7-0 Prolene suture.  This anastomosis was de-aired before reestablishing flow.  All proximal and distal anastomoses were inspected for hemostasis.  Epicardial pacing wires were placed on the right ventricle and right atrium.  The patient was in sinus rhythm with a satisfactory rate, so pacing was not initiated.  When the patient had rewarmed to a core temperature of 37 degrees Celsius, he was weaned from cardiopulmonary bypass on the first attempt. He was in sinus rhythm and on no inotropic support.  The total bypass time was 136 minutes.  The initial cardiac index was greater than 2 L/minute/sq m and the patient remained hemodynamically stable throughout the postbypass period.  Postbypass transesophageal echocardiography showed no change in left ventricular function.  A test dose of protamine was administered and was well tolerated.  The atrial and aortic cannulae were removed.  The remainder of the protamine was administered without incident.  The chest irrigated with warm saline.  Hemostasis was achieved.  Bilateral pleural tubes and a single mediastinal chest tube were placed via 3 separate subcostal incisions. The pericardium was reapproximated with interrupted 3-0 silk sutures. It came together easily without undue tension.  The sternum was closed with a combination of single and double heavy gauge stainless steel wires.  The pectoralis fascia, subcutaneous tissue, and skin were closed in standard fashion.  All sponge, needle, and instrument counts were correct at the end of the procedure.  The patient was taken from the operating room to the surgical intensive care unit in good condition.     Salvatore Decent Dorris Fetch, M.D.     SCH/MEDQ  D:  07/14/2014  T:  07/14/2014  Job:  [id number]

## 2014-07-15 NOTE — Procedures (Signed)
Extubation Procedure Note  Patient Details:   Name: Philipp DeputyJay Answan Orser DOB: Dec 28, 1960 MRN: [id number]   Airway Documentation:     Evaluation  O2 sats: stable throughout Complications: No apparent complications Patient did tolerate procedure well. Bilateral Breath Sounds: Clear, Diminished Suctioning: Airway Yes   NIF -20, and VC 1.2L. Cuff leak noted. Extubated pt to 4lpm [place] humidified. No complications noted. No stridor noted.   Shonna ChockBurleson, Shantee Hayne C 07/15/2014, 2:40 AM

## 2014-07-15 NOTE — Progress Notes (Signed)
1 Day Post-Op Procedure(s) (LRB): CORONARY ARTERY BYPASS GRAFTING (CABG), ON PUMP, TIMES FIVE, USING BILATERAL MAMMARY ARTERIES, RIGHT GREATER SAPHENOUS VEIN HARVESTED ENDOSCOPICALLY (N/A) TRANSESOPHAGEAL ECHOCARDIOGRAM (TEE) (N/A) Subjective: CABG for unstable angina, extubated this morning with stable hemodynamics  Objective: Vital signs in last 24 hours: Temp:  [96.4 F (35.8 C)-100.2 F (37.9 C)] 100 F (37.8 C) (04/23 0700) Pulse Rate:  [63-91] 90 (04/23 0700) Cardiac Rhythm:  [-] Atrial paced (04/23 0700) Resp:  [0-33] 22 (04/23 0700) BP: (103-148)/(63-93) 111/63 mmHg (04/23 0700) SpO2:  [94 %-100 %] 100 % (04/23 0700) Arterial Line BP: (86-151)/(60-90) 108/63 mmHg (04/23 0700) FiO2 (%):  [40 %-50 %] 40 % (04/23 0200) Weight:  [278 lb 14.4 oz (126.508 kg)] 278 lb 14.4 oz (126.508 kg) (04/23 0330)  Hemodynamic parameters for last 24 hours: PAP: (7-32)/(0-21) 12/4 mmHg CO:  [4.3 L/min-7.2 L/min] 6.9 L/min CI:  [1.8 L/min/m2-3 L/min/m2] 2.9 L/min/m2  Intake/Output from previous day: 04/22 0701 - 04/23 0700 In: 5541.6 [P.O.:150; I.V.:3590.6; Blood:601; IV Piggyback:1200] Out: 4150 [Urine:2570; Blood:1250; Chest Tube:330] Intake/Output this shift: Total I/O In: -  Out: 110 [Urine:100; Chest Tube:10]  Neuro intact  Lab Results:  Recent Labs  07/14/14 1855 07/14/14 1856 07/15/14 0331  WBC 14.7*  --  9.1  HGB 11.4* 11.2* 9.7*  HCT 31.9* 33.0* 28.4*  PLT 125*  --  122*   BMET:  Recent Labs  07/14/14 1742 07/14/14 1856 07/15/14 0331  NA 139 141 139  K 4.6 4.3 4.3  CL 105  --  110  CO2  --   --  22  GLUCOSE 153* 113* 143*  BUN 7  --  7  CREATININE 0.70  --  0.73  CALCIUM  --   --  7.9*    PT/INR:  Recent Labs  07/14/14 1855  LABPROT 15.8*  INR 1.25   ABG    Component Value Date/Time   PHART 7.385 07/15/2014 0336   HCO3 20.8 07/15/2014 0336   TCO2 22 07/15/2014 0336   ACIDBASEDEF 4.0* 07/15/2014 0336   O2SAT 96.0 07/15/2014 0336   CBG (last 3)    Recent Labs  07/14/14 2356 07/15/14 0334 07/15/14 0821  GLUCAP 137* 126* 122*    Assessment/Plan: S/P Procedure(s) (LRB): CORONARY ARTERY BYPASS GRAFTING (CABG), ON PUMP, TIMES FIVE, USING BILATERAL MAMMARY ARTERIES, RIGHT GREATER SAPHENOUS VEIN HARVESTED ENDOSCOPICALLY (N/A) TRANSESOPHAGEAL ECHOCARDIOGRAM (TEE) (N/A) Mobilize Diuresis d/c tubes/lines See progression orders   LOS: 6 days    Benjamin King 07/15/2014

## 2014-07-15 NOTE — Anesthesia Postprocedure Evaluation (Signed)
  Anesthesia Post-op Note  Patient: Benjamin King  Procedure(s) Performed: Procedure(s) with comments: CORONARY ARTERY BYPASS GRAFTING (CABG), ON PUMP, TIMES FIVE, USING BILATERAL MAMMARY ARTERIES, RIGHT GREATER SAPHENOUS VEIN HARVESTED ENDOSCOPICALLY (N/A) - Bilateral Mammary Arteries TRANSESOPHAGEAL ECHOCARDIOGRAM (TEE) (N/A)  Patient Location: ICU  Anesthesia Type:General  Level of Consciousness: sedated  Airway and Oxygen Therapy: Patient remains intubated per anesthesia plan  Post-op Pain: none  Post-op Assessment: Post-op Vital signs reviewed, Patient's Cardiovascular Status Stable and Respiratory Function Stable  Post-op Vital Signs: Reviewed and stable  Last Vitals:  Filed Vitals:   07/15/14 0620  BP:   Pulse: 90  Temp: 37.8 C  Resp: 21    Complications: No apparent anesthesia complications

## 2014-07-15 NOTE — Progress Notes (Signed)
NIF -20cmH20, and VC 1.2L

## 2014-07-16 ENCOUNTER — Inpatient Hospital Stay (HOSPITAL_COMMUNITY): Payer: 59

## 2014-07-16 LAB — CBC
HCT: 31 % — ABNORMAL LOW (ref 39.0–52.0)
Hemoglobin: 10.7 g/dL — ABNORMAL LOW (ref 13.0–17.0)
MCH: 30.7 pg (ref 26.0–34.0)
MCHC: 34.5 g/dL (ref 30.0–36.0)
MCV: 88.8 fL (ref 78.0–100.0)
Platelets: 180 10*3/uL (ref 150–400)
RBC: 3.49 MIL/uL — ABNORMAL LOW (ref 4.22–5.81)
RDW: 13.4 % (ref 11.5–15.5)
WBC: 18.5 10*3/uL — ABNORMAL HIGH (ref 4.0–10.5)

## 2014-07-16 LAB — BASIC METABOLIC PANEL
Anion gap: 8 (ref 5–15)
BUN: 6 mg/dL (ref 6–23)
CO2: 24 mmol/L (ref 19–32)
Calcium: 8.1 mg/dL — ABNORMAL LOW (ref 8.4–10.5)
Chloride: 102 mmol/L (ref 96–112)
Creatinine, Ser: 0.72 mg/dL (ref 0.50–1.35)
GFR calc Af Amer: >90 mL/min (ref >90)
GFR calc non Af Amer: >90 mL/min (ref >90)
Glucose, Bld: 120 mg/dL — ABNORMAL HIGH (ref 70–99)
Potassium: 4 mmol/L (ref 3.5–5.1)
Sodium: 134 mmol/L — ABNORMAL LOW (ref 135–145)

## 2014-07-16 LAB — GLUCOSE, CAPILLARY
GLUCOSE-CAPILLARY: 124 mg/dL — AB (ref 70–99)
GLUCOSE-CAPILLARY: 127 mg/dL — AB (ref 70–99)
Glucose-Capillary: 109 mg/dL — ABNORMAL HIGH (ref 70–99)
Glucose-Capillary: 112 mg/dL — ABNORMAL HIGH (ref 70–99)
Glucose-Capillary: 140 mg/dL — ABNORMAL HIGH (ref 70–99)

## 2014-07-16 MED ORDER — SODIUM CHLORIDE 0.9 % IJ SOLN: 3.0000 mL | INTRAMUSCULAR | Status: DC | PRN

## 2014-07-16 MED ORDER — MAGNESIUM HYDROXIDE 400 MG/5ML PO SUSP: 30.0000 mL | Freq: Every day | ORAL | Status: DC | PRN

## 2014-07-16 MED ORDER — SODIUM CHLORIDE 0.9 % IV SOLN: 250.0000 mL | INTRAVENOUS | Status: DC | PRN

## 2014-07-16 MED ORDER — POTASSIUM CHLORIDE CRYS ER 10 MEQ PO TBCR
10.0000 meq | EXTENDED_RELEASE_TABLET | Freq: Two times a day (BID) | ORAL | Status: DC
  Administered 2014-07-16 – 2014-07-17 (×4): 10 meq via ORAL
  Filled 2014-07-16 (×7): qty 1

## 2014-07-16 MED ORDER — SODIUM CHLORIDE 0.9 % IJ SOLN
3.0000 mL | Freq: Two times a day (BID) | INTRAMUSCULAR | Status: DC
  Administered 2014-07-16: 3 mL via INTRAVENOUS
  Administered 2014-07-16: 10 mL via INTRAVENOUS
  Administered 2014-07-17 – 2014-07-19 (×5): 3 mL via INTRAVENOUS

## 2014-07-16 MED ORDER — INSULIN ASPART 100 UNIT/ML ~~LOC~~ SOLN
0.0000 [IU] | Freq: Three times a day (TID) | SUBCUTANEOUS | Status: DC
  Administered 2014-07-17: 2 [IU] via SUBCUTANEOUS

## 2014-07-16 MED ORDER — FUROSEMIDE 40 MG PO TABS: 40.0000 mg | ORAL_TABLET | Freq: Every day | ORAL | Status: DC

## 2014-07-16 MED ORDER — AMIODARONE HCL IN DEXTROSE 360-4.14 MG/200ML-% IV SOLN
60.0000 mg/h | INTRAVENOUS | Status: AC
  Administered 2014-07-16 – 2014-07-17 (×2): 60 mg/h via INTRAVENOUS
  Filled 2014-07-16: qty 200

## 2014-07-16 MED ORDER — METOPROLOL TARTRATE 25 MG PO TABS
25.0000 mg | ORAL_TABLET | Freq: Two times a day (BID) | ORAL | Status: DC
  Administered 2014-07-16 – 2014-07-17 (×2): 25 mg via ORAL
  Filled 2014-07-16 (×4): qty 1

## 2014-07-16 MED ORDER — AMIODARONE HCL IN DEXTROSE 360-4.14 MG/200ML-% IV SOLN
30.0000 mg/h | INTRAVENOUS | Status: AC
  Administered 2014-07-17 (×3): 30 mg/h via INTRAVENOUS
  Filled 2014-07-16 (×7): qty 200

## 2014-07-16 MED ORDER — FUROSEMIDE 10 MG/ML IJ SOLN
20.0000 mg | Freq: Once | INTRAMUSCULAR | Status: AC
  Administered 2014-07-16: 20 mg via INTRAVENOUS
  Filled 2014-07-16: qty 2

## 2014-07-16 MED ORDER — MOVING RIGHT ALONG BOOK
Freq: Once | Status: DC
  Filled 2014-07-16: qty 1

## 2014-07-16 MED ORDER — FUROSEMIDE 40 MG PO TABS
40.0000 mg | ORAL_TABLET | Freq: Every day | ORAL | Status: DC
  Administered 2014-07-17 – 2014-07-20 (×4): 40 mg via ORAL
  Filled 2014-07-16 (×4): qty 1

## 2014-07-16 MED ORDER — AMIODARONE HCL 200 MG PO TABS: 400.0000 mg | ORAL_TABLET | Freq: Every day | ORAL | Status: DC

## 2014-07-16 NOTE — Progress Notes (Signed)
Pt received to room 2W23 via wheel chair. 2W monitor applied. Pt assisted to chair and set up for lunch. Pt and family oriented to room and unit routines. How to call for staff instructions given and call bell and telephone in reach.

## 2014-07-16 NOTE — Progress Notes (Signed)
2 Days Post-Op Procedure(s) (LRB): CORONARY ARTERY BYPASS GRAFTING (CABG), ON PUMP, TIMES FIVE, USING BILATERAL MAMMARY ARTERIES, RIGHT GREATER SAPHENOUS VEIN HARVESTED ENDOSCOPICALLY (N/A) TRANSESOPHAGEAL ECHOCARDIOGRAM (TEE) (N/A) Subjective: nsr with PACs CXR clear Walked 2 laps  Objective: Vital signs in last 24 hours: Temp:  [98.5 F (36.9 C)-100.1 F (37.8 C)] 99.7 F (37.6 C) (04/24 0823) Pulse Rate:  [86-115] 100 (04/24 1000) Cardiac Rhythm:  [-] Atrial fibrillation (04/24 0800) Resp:  [19-42] 23 (04/24 1000) BP: (111-151)/(68-92) 123/77 mmHg (04/24 1000) SpO2:  [91 %-99 %] 95 % (04/24 1000) Arterial Line BP: (113-198)/(50-122) 145/71 mmHg (04/24 0600) Weight:  [281 lb 12 oz (127.8 kg)] 281 lb 12 oz (127.8 kg) (04/24 0600)  Hemodynamic parameters for last 24 hours:  stable  Intake/Output from previous day: 04/23 0701 - 04/24 0700 In: 1926 [P.O.:1260; I.V.:566; IV Piggyback:100] Out: 2230 [Urine:1860; Chest Tube:370] Intake/Output this shift: Total I/O In: 180 [P.O.:120; I.V.:60] Out: 155 [Urine:45; Chest Tube:110]  Neuro intact  Lab Results:  Recent Labs  07/15/14 1700 07/15/14 1719 07/16/14 0500  WBC 13.8*  --  18.5*  HGB 10.9* 11.6* 10.7*  HCT 31.6* 34.0* 31.0*  PLT 171  --  180   BMET:  Recent Labs  07/15/14 0331  07/15/14 1719 07/16/14 0500  NA 139  --  136 134*  K 4.3  --  3.8 4.0  CL 110  --  101 102  CO2 22  --   --  24  GLUCOSE 143*  --  141* 120*  BUN 7  --  6 6  CREATININE 0.73  < > 0.60 0.72  CALCIUM 7.9*  --   --  8.1*  < > = values in this interval not displayed.  PT/INR:  Recent Labs  07/14/14 1855  LABPROT 15.8*  INR 1.25   ABG    Component Value Date/Time   PHART 7.385 07/15/2014 0336   HCO3 20.8 07/15/2014 0336   TCO2 19 07/15/2014 1719   ACIDBASEDEF 4.0* 07/15/2014 0336   O2SAT 96.0 07/15/2014 0336   CBG (last 3)   Recent Labs  07/15/14 1924 07/15/14 2346 07/16/14 0358  GLUCAP 133* 140* 124*     Assessment/Plan: S/P Procedure(s) (LRB): CORONARY ARTERY BYPASS GRAFTING (CABG), ON PUMP, TIMES FIVE, USING BILATERAL MAMMARY ARTERIES, RIGHT GREATER SAPHENOUS VEIN HARVESTED ENDOSCOPICALLY (N/A) TRANSESOPHAGEAL ECHOCARDIOGRAM (TEE) (N/A) Mobilize Diuresis Plan for transfer to step-down: see transfer orders Follow elevated WBC  LOS: 7 days    Kathlee Nationseter Van Trigt III 07/16/2014

## 2014-07-16 NOTE — Progress Notes (Signed)
Pt converted to atrial fibrillation with RVR in the 150's at 1730 during transfer from chair to bed. BP stable. IV metoprolol 5 mg given with conversion to sinus tach after 15 minutes. Pt denied chest pain or dyspnea. After another 15 minutes pt began to have bursts of atrial fib. BP remained stable. When pt was in atrial fib in 140's more than sinus rhythm I notified Dr. Donata ClayVan Trigt. Instructed to repeat IV metoprolol dose and begin amiodarone drip. At time of amiodarone initiation patient's rhythm was ST 100-110. Pt and wife educated on atrial fibrillation and metoprolol, amiodarone.

## 2014-07-17 ENCOUNTER — Inpatient Hospital Stay (HOSPITAL_COMMUNITY): Payer: 59

## 2014-07-17 ENCOUNTER — Encounter (HOSPITAL_COMMUNITY): Payer: Self-pay | Admitting: Thoracic Surgery (Cardiothoracic Vascular Surgery)

## 2014-07-17 DIAGNOSIS — I9789 Other postprocedural complications and disorders of the circulatory system, not elsewhere classified: Secondary | ICD-10-CM

## 2014-07-17 DIAGNOSIS — I4891 Unspecified atrial fibrillation: Secondary | ICD-10-CM

## 2014-07-17 LAB — CBC
HCT: 33.6 % — ABNORMAL LOW (ref 39.0–52.0)
Hemoglobin: 11.2 g/dL — ABNORMAL LOW (ref 13.0–17.0)
MCH: 29.9 pg (ref 26.0–34.0)
MCHC: 33.3 g/dL (ref 30.0–36.0)
MCV: 89.6 fL (ref 78.0–100.0)
Platelets: 208 10*3/uL (ref 150–400)
RBC: 3.75 MIL/uL — ABNORMAL LOW (ref 4.22–5.81)
RDW: 13.7 % (ref 11.5–15.5)
WBC: 21.6 10*3/uL — ABNORMAL HIGH (ref 4.0–10.5)

## 2014-07-17 LAB — URINALYSIS, ROUTINE W REFLEX MICROSCOPIC
Bilirubin Urine: NEGATIVE
GLUCOSE, UA: NEGATIVE mg/dL
Ketones, ur: NEGATIVE mg/dL
LEUKOCYTES UA: NEGATIVE
Nitrite: NEGATIVE
PROTEIN: NEGATIVE mg/dL
Specific Gravity, Urine: 1.011 (ref 1.005–1.030)
Urobilinogen, UA: 0.2 mg/dL (ref 0.0–1.0)
pH: 6 (ref 5.0–8.0)

## 2014-07-17 LAB — BASIC METABOLIC PANEL
Anion gap: 11 (ref 5–15)
BUN: 9 mg/dL (ref 6–23)
CO2: 22 mmol/L (ref 19–32)
Calcium: 8.3 mg/dL — ABNORMAL LOW (ref 8.4–10.5)
Chloride: 99 mmol/L (ref 96–112)
Creatinine, Ser: 0.83 mg/dL (ref 0.50–1.35)
GFR calc Af Amer: >90 mL/min (ref >90)
GFR calc non Af Amer: >90 mL/min (ref >90)
Glucose, Bld: 101 mg/dL — ABNORMAL HIGH (ref 70–99)
Potassium: 3.9 mmol/L (ref 3.5–5.1)
Sodium: 132 mmol/L — ABNORMAL LOW (ref 135–145)

## 2014-07-17 LAB — URINE MICROSCOPIC-ADD ON

## 2014-07-17 LAB — GLUCOSE, CAPILLARY
GLUCOSE-CAPILLARY: 117 mg/dL — AB (ref 70–99)
GLUCOSE-CAPILLARY: 115 mg/dL — AB (ref 70–99)
Glucose-Capillary: 125 mg/dL — ABNORMAL HIGH (ref 70–99)
Glucose-Capillary: 99 mg/dL (ref 70–99)

## 2014-07-17 MED ORDER — METOPROLOL TARTRATE 25 MG PO TABS
25.0000 mg | ORAL_TABLET | Freq: Three times a day (TID) | ORAL | Status: DC
Start: 2014-07-17 — End: 2014-07-19
  Administered 2014-07-17 – 2014-07-18 (×5): 25 mg via ORAL
  Filled 2014-07-17 (×8): qty 1

## 2014-07-17 MED FILL — Heparin Sodium (Porcine) Inj 1000 Unit/ML: INTRAMUSCULAR | Qty: 30 | Status: AC

## 2014-07-17 MED FILL — Potassium Chloride Inj 2 mEq/ML: INTRAVENOUS | Qty: 40 | Status: AC

## 2014-07-17 MED FILL — Sodium Chloride IV Soln 0.9%: INTRAVENOUS | Qty: 2000 | Status: AC

## 2014-07-17 MED FILL — Electrolyte-R (PH 7.4) Solution: INTRAVENOUS | Qty: 3000 | Status: AC

## 2014-07-17 MED FILL — Sodium Bicarbonate IV Soln 8.4%: INTRAVENOUS | Qty: 50 | Status: AC

## 2014-07-17 MED FILL — Magnesium Sulfate Inj 50%: INTRAMUSCULAR | Qty: 10 | Status: AC

## 2014-07-17 MED FILL — Heparin Sodium (Porcine) Inj 1000 Unit/ML: INTRAMUSCULAR | Qty: 60 | Status: AC

## 2014-07-17 MED FILL — Mannitol IV Soln 20%: INTRAVENOUS | Qty: 500 | Status: AC

## 2014-07-17 MED FILL — Lidocaine HCl IV Inj 20 MG/ML: INTRAVENOUS | Qty: 5 | Status: AC

## 2014-07-17 NOTE — Progress Notes (Signed)
Pt converted to afib RVR around 150s at 0145. Pt returned to normal sinus rhythm 5 minutes later. Pt resting comfortably. Call bell within reach and amiodarone drip continuing at 6233mL/hour  Will continue to monitor.   Benjamin King, Benjamin King

## 2014-07-17 NOTE — Progress Notes (Addendum)
[address]       [place] [address]             [phone]      3 Days Post-Op Procedure(s) (LRB): CORONARY ARTERY BYPASS GRAFTING (CABG), ON PUMP, TIMES FIVE, USING BILATERAL MAMMARY ARTERIES, RIGHT GREATER SAPHENOUS VEIN HARVESTED ENDOSCOPICALLY (N/A) TRANSESOPHAGEAL ECHOCARDIOGRAM (TEE) (N/A) Subjective: Feels pretty well, afib now in sinus tach on amio gtt  Objective: Vital signs in last 24 hours: Temp:  [98.5 F (36.9 C)-100.1 F (37.8 C)] 100.1 F (37.8 C) (04/25 0425) Pulse Rate:  [96-155] 101 (04/25 0425) Cardiac Rhythm:  [-] Sinus tachycardia (04/24 2010) Resp:  [18-28] 18 (04/25 0425) BP: (111-138)/(66-87) 125/79 mmHg (04/25 0425) SpO2:  [95 %-100 %] 97 % (04/25 0425) Weight:  [279 lb (126.554 kg)] 279 lb (126.554 kg) (04/25 0300)  Hemodynamic parameters for last 24 hours:    Intake/Output from previous day: 04/24 0701 - 04/25 0700 In: 540 [P.O.:480; I.V.:60] Out: 1055 [Urine:905; Chest Tube:150] Intake/Output this shift:    General appearance: alert, cooperative and no distress Heart: regular rate and rhythm Lungs: dim in lower fields Abdomen: benign Extremities: mild edema Wound: ok  Lab Results:  Recent Labs  07/16/14 0500 07/17/14 0510  WBC 18.5* 21.6*  HGB 10.7* 11.2*  HCT 31.0* 33.6*  PLT 180 208   BMET:  Recent Labs  07/16/14 0500 07/17/14 0510  NA 134* 132*  K 4.0 3.9  CL 102 99  CO2 24 22  GLUCOSE 120* 101*  BUN 6 9  CREATININE 0.72 0.83  CALCIUM 8.1* 8.3*    PT/INR:  Recent Labs  07/14/14 1855  LABPROT 15.8*  INR 1.25   ABG    Component Value Date/Time   PHART 7.385 07/15/2014 0336   HCO3 20.8 07/15/2014 0336   TCO2 19 07/15/2014 1719   ACIDBASEDEF 4.0* 07/15/2014 0336   O2SAT 96.0 07/15/2014 0336   CBG (last 3)   Recent Labs  07/16/14 1212 07/16/14 2209 07/17/14 0540  GLUCAP 109* 112* 125*    Meds Scheduled Meds: . acetaminophen  1,000 mg Oral 4 times per day   Or  .  acetaminophen (TYLENOL) oral liquid 160 mg/5 mL  1,000 mg Per Tube 4 times per day  . antiseptic oral rinse  7 mL Mouth Rinse QID  . aspirin EC  325 mg Oral Daily   Or  . aspirin  324 mg Per Tube Daily  . bisacodyl  10 mg Oral Daily   Or  . bisacodyl  10 mg Rectal Daily  . chlorhexidine  15 mL Mouth Rinse BID  . docusate sodium  200 mg Oral Daily  . furosemide  40 mg Oral Daily  . insulin aspart  0-24 Units Subcutaneous TID AC & HS  . metoprolol tartrate  25 mg Oral BID WC  . moving right along book   Does not apply Once  . pantoprazole  40 mg Oral Daily  . potassium chloride  10 mEq Oral BID  . simvastatin  20 mg Oral q1800  . sodium chloride  3 mL Intravenous Q12H  . sodium chloride  3 mL Intravenous Q12H   Continuous Infusions: . sodium chloride Stopped (07/15/14 1100)  . sodium chloride    . sodium chloride    . amiodarone 30 mg/hr (07/17/14 0244)   PRN Meds:.sodium chloride, sodium chloride, magnesium hydroxide, metoprolol, ondansetron (ZOFRAN) IV, oxyCODONE, sodium chloride, sodium chloride, traMADol  Xrays Dg Chest 2 View  07/17/2014  CLINICAL DATA:  Status post CABG 3 days ago.  EXAM: CHEST  2 VIEW  COMPARISON:  Single view of the chest 07/16/2014 and 07/15/2014.  FINDINGS: Right IJ sheath and left chest tube have been removed. Six intact median sternotomy wires are again seen. Lung volumes are low with mild basilar atelectasis and there are small bilateral pleural effusions. No pneumothorax or pulmonary edema.  IMPRESSION: Negative for pneumothorax after chest tube removal.  Small bilateral pleural effusions and bibasilar atelectasis.   Electronically Signed   By: Drusilla Kannerhomas  Dalessio M.D.   On: 07/17/2014 07:50   Dg Chest Port 1 View  07/16/2014   CLINICAL DATA:  Two days postop CABG.  Followup basilar atelectasis.  EXAM: PORTABLE CHEST - 1 VIEW  COMPARISON:  Portable chest x-rays yesterday, 07/14/2014.  FINDINGS: Interval Swan-Ganz catheter removal and right chest tube  removal. No right pneumothorax. Left chest tube remains in place with no pneumothorax. Right jugular introducer sheath tip projects at the junction of the jugular and innominate vein.  Sternotomy for CABG. Cardiac silhouette moderately enlarged, unchanged. Mild pulmonary venous hypertension without overt edema. Suboptimal inspiration will mild bibasilar atelectasis, unchanged. No new pulmonary parenchymal abnormalities.  IMPRESSION: 1. No pneumothorax after right chest tube removal. 2. Support apparatus satisfactory. 3. Suboptimal inspiration accounts for mild bibasilar atelectasis, stable. No new abnormalities.   Electronically Signed   By: Hulan Saashomas  Lawrence M.D.   On: 07/16/2014 09:59    Assessment/Plan: S/P Procedure(s) (LRB): CORONARY ARTERY BYPASS GRAFTING (CABG), ON PUMP, TIMES FIVE, USING BILATERAL MAMMARY ARTERIES, RIGHT GREATER SAPHENOUS VEIN HARVESTED ENDOSCOPICALLY (N/A) TRANSESOPHAGEAL ECHOCARDIOGRAM (TEE) (N/A)  1 cont amio load for afib 2 leukocytosis increased, low grade temp- monitor closely, push rehab/pulm toilet 3 H/H improved 4cont gentle diuresis for volume overload    LOS: 8 days    GOLD,WAYNE E 07/17/2014  Patient seen and examined, agree with above Not sure of etiology of leukocytosis- wounds are clean, central line is out, IV site is ok, no abdominal complaints.  Check UA  Chest x ray shows bibasilar atelectasis, but no infiltrate He is in SR, but having a lot of PACs- will continue amiodarone IV this morning  Viviann SpareSteven C. Dorris FetchHendrickson, MD [facility] [phone]

## 2014-07-17 NOTE — Progress Notes (Signed)
    Subjective:  The patient is feeling okay. He has expected chest soreness. No shortness of breath or other complaints.  Objective:  Vital Signs in the last 24 hours: Temp:  [98.5 F (36.9 C)-100.1 F (37.8 C)] 100.1 F (37.8 C) (04/25 0425) Pulse Rate:  [85-155] 85 (04/25 0924) Resp:  [18-26] 18 (04/25 0425) BP: (111-138)/(66-87) 121/73 mmHg (04/25 0924) SpO2:  [95 %-100 %] 97 % (04/25 0425) Weight:  [279 lb (126.554 kg)] 279 lb (126.554 kg) (04/25 0300)  Intake/Output from previous day: 04/24 0701 - 04/25 0700 In: 540 [P.O.:480; I.V.:60] Out: 1055 [Urine:905; Chest Tube:150]  Physical Exam: Pt is alert and oriented, NAD HEENT: normal Neck: JVP - normal Lungs: CTA bilaterally CV: Mild tachycardia with frequent premature beats Abd: soft, NT, Positive BS, no hepatomegaly Ext: no C/C/E, distal pulses intact and equal Skin: warm/dry no rash   Lab Results:  Recent Labs  07/16/14 0500 07/17/14 0510  WBC 18.5* 21.6*  HGB 10.7* 11.2*  PLT 180 208    Recent Labs  07/16/14 0500 07/17/14 0510  NA 134* 132*  K 4.0 3.9  CL 102 99  CO2 24 22  GLUCOSE 120* 101*  BUN 6 9  CREATININE 0.72 0.83   No results for input(s): TROPONINI in the last 72 hours.  Invalid input(s): CK, MB  Tele: Sinus rhythm/sinus tachycardia. There was a 10 minute episode of rapid atrial fibrillation this morning from 10 AM to 10:10 AM. Heart rate with atrial fibrillation was approximately 140 bpm.  Assessment/Plan:  1. Non-STEMI with multivessel coronary artery disease, now postoperative day #3 from multivessel CABG. 2. Postoperative atrial fibrillation, remains on IV amiodarone 3. Essential hypertension 4. Hyperlipidemia 5. Tobacco abuse  The patient continues to have short runs of atrial fibrillation with RVR and frequent PACs. I agree with keeping him on IV amiodarone. Will increase metoprolol to 3 times a day dosing. Otherwise medications reviewed and appropriate with aspirin and a  statin drug.   Tonny BollmanMichael Dimitris Shanahan, M.D. 07/17/2014, 10:21 AM

## 2014-07-17 NOTE — Progress Notes (Signed)
Pt converted to afib rvr with a rate around 150. Pt VSS and stated "he felt fluttering in chest". Pt already on amiodarone drip at a rate of 33 mL/hour. Pt resting comfortably and call bell within reach.  At approx 0130 pt converted back to SR/ST.   Will continue to monitor.   Edgardo RoysMcGrath, Amel Kitch R

## 2014-07-17 NOTE — Progress Notes (Addendum)
PA paged, pt HR 150s, given iv metoprolol $RemoveBefore EID_CsknhAAnASyQazraNmoidxUYloxQwjXj$5mg40-150, PA would like for pt to be rebolused 150 of amio but if not successful, instructed to paged on call MD for further orders. Benjamin King,Benjamin King 6:03 PM   Pt currently in SR 103 will hold off on rebolus at time, pt also received po metoprolol, will continue to monitor.  6:10 PM

## 2014-07-17 NOTE — Progress Notes (Signed)
CARDIAC REHAB PHASE I   PRE:  Rate/Rhythm: 102 ST PACs  BP:  Supine: 121/79  Sitting:   Standing:    SaO2: 96% 2.5L  MODE:  Ambulation: 550 ft   POST:  Rate/Rhythm: 150s afib during walk, to 102 ST with PACs after back to bed  BP:  Supine: 138/84  Sitting:   Standing:    SaO2: 93% 2L [id number] Pt diaphoretic and wife stated he has been that way at home some too. Changed gown, pad and pillow case. Pt walked 550 ft on 2L with rolling walker, pacing self with breathing. Denied palpitations but heart rate up with activity. Pt stated he has not slept and wanted to go back to bed. Assisted to bed and back to NSR. Encouraged IS.   [name] Viviene Thurston, RN BSN  07/17/2014 10:22 AM

## 2014-07-17 NOTE — Progress Notes (Signed)
Pt ambulated 300 feet with rolling walking, and 2L [place]. Pt tolerated well and became mildly SOB and HR in 140s. Pt returned to bed and resting comfortably. Call bell within reach.   Will continue to monitor.   Benjamin RoysMcGrath, Ruel Dimmick R

## 2014-07-18 DIAGNOSIS — I480 Paroxysmal atrial fibrillation: Secondary | ICD-10-CM

## 2014-07-18 LAB — BASIC METABOLIC PANEL
Anion gap: 13 (ref 5–15)
BUN: 11 mg/dL (ref 6–23)
CHLORIDE: 100 mmol/L (ref 96–112)
CO2: 22 mmol/L (ref 19–32)
Calcium: 8.5 mg/dL (ref 8.4–10.5)
Creatinine, Ser: 0.8 mg/dL (ref 0.50–1.35)
GFR calc Af Amer: >90 mL/min (ref >90)
GFR calc non Af Amer: >90 mL/min (ref >90)
GLUCOSE: 108 mg/dL — AB (ref 70–99)
Potassium: 3.8 mmol/L (ref 3.5–5.1)
Sodium: 135 mmol/L (ref 135–145)

## 2014-07-18 LAB — CBC
HCT: 32.5 % — ABNORMAL LOW (ref 39.0–52.0)
Hemoglobin: 11 g/dL — ABNORMAL LOW (ref 13.0–17.0)
MCH: 30.3 pg (ref 26.0–34.0)
MCHC: 33.8 g/dL (ref 30.0–36.0)
MCV: 89.5 fL (ref 78.0–100.0)
Platelets: 278 10*3/uL (ref 150–400)
RBC: 3.63 MIL/uL — ABNORMAL LOW (ref 4.22–5.81)
RDW: 13.6 % (ref 11.5–15.5)
WBC: 20 10*3/uL — ABNORMAL HIGH (ref 4.0–10.5)

## 2014-07-18 LAB — GLUCOSE, CAPILLARY: Glucose-Capillary: 118 mg/dL — ABNORMAL HIGH (ref 70–99)

## 2014-07-18 MED ORDER — POTASSIUM CHLORIDE CRYS ER 20 MEQ PO TBCR
20.0000 meq | EXTENDED_RELEASE_TABLET | Freq: Two times a day (BID) | ORAL | Status: DC
  Administered 2014-07-18 – 2014-07-20 (×5): 20 meq via ORAL
  Filled 2014-07-18 (×6): qty 1

## 2014-07-18 MED ORDER — FLEET ENEMA 7-19 GM/118ML RE ENEM
1.0000 | ENEMA | Freq: Every day | RECTAL | Status: DC | PRN
Start: 2014-07-18 — End: 2014-07-20
  Filled 2014-07-18: qty 1

## 2014-07-18 MED ORDER — LEVOFLOXACIN 500 MG PO TABS
500.0000 mg | ORAL_TABLET | Freq: Every day | ORAL | Status: DC
Start: 2014-07-18 — End: 2014-07-20
  Administered 2014-07-18 – 2014-07-20 (×3): 500 mg via ORAL
  Filled 2014-07-18 (×3): qty 1

## 2014-07-18 MED ORDER — LACTULOSE 10 GM/15ML PO SOLN
20.0000 g | Freq: Every day | ORAL | Status: DC | PRN
  Administered 2014-07-18: 20 g via ORAL
  Filled 2014-07-18 (×2): qty 30

## 2014-07-18 MED ORDER — AMIODARONE HCL 200 MG PO TABS
400.0000 mg | ORAL_TABLET | Freq: Two times a day (BID) | ORAL | Status: DC
  Administered 2014-07-18 – 2014-07-20 (×5): 400 mg via ORAL
  Filled 2014-07-18 (×6): qty 2

## 2014-07-18 MED ORDER — LACTULOSE 10 GM/15ML PO SOLN
30.0000 g | Freq: Once | ORAL | Status: AC
  Administered 2014-07-18: 30 g via ORAL
  Filled 2014-07-18: qty 45

## 2014-07-18 MED ORDER — LISINOPRIL 5 MG PO TABS
5.0000 mg | ORAL_TABLET | Freq: Every day | ORAL | Status: DC
  Administered 2014-07-18 – 2014-07-20 (×3): 5 mg via ORAL
  Filled 2014-07-18 (×3): qty 1

## 2014-07-18 NOTE — Progress Notes (Signed)
CARDIAC REHAB PHASE I   PRE:  Rate/Rhythm: 106 ST  BP:  Supine:   Sitting: 155/70  Standing:    SaO2: 94%RA  MODE:  Ambulation: 550 ft   POST:  Rate/Rhythm: 121 ST PACs  BP:  Supine:   Sitting: 160/75  Standing:    SaO2: 92% RA hall and 95%RA room 1420-1453 Pt walked 550 ft on RA with rolling walker with steady gait. Tolerated well on RA. Stopped once to rest. Remained in NSR this walk. To recliner after walk.    [name] [name], RN BSN  07/18/2014 2:50 PM

## 2014-07-18 NOTE — Progress Notes (Addendum)
Pt ambulated 600 feet with rolling walker, had to stop and rest once. Pt c/o of being diaphoretic while just standing. Pt linens changed. Pt resting comfortably in bed with call bell within reach.   Will continue to monitor.   Edgardo RoysMcGrath, Miklo Aken R

## 2014-07-18 NOTE — Progress Notes (Signed)
Utilization review completed.  

## 2014-07-18 NOTE — Progress Notes (Addendum)
      [address]       [place] [id number]             [phone]      4 Days Post-Op Procedure(s) (LRB): CORONARY ARTERY BYPASS GRAFTING (CABG), ON PUMP, TIMES FIVE, USING BILATERAL MAMMARY ARTERIES, RIGHT GREATER SAPHENOUS VEIN HARVESTED ENDOSCOPICALLY (N/A) TRANSESOPHAGEAL ECHOCARDIOGRAM (TEE) (N/A)   Subjective:  [name] has no new complaints.  Continues to have episodes of A. Fib  Objective: Vital signs in last 24 hours: Temp:  [98.5 F (36.9 C)-99.7 F (37.6 C)] 98.5 F (36.9 C) (04/26 0436) Pulse Rate:  [85-152] 92 (04/26 0436) Cardiac Rhythm:  [-] Sinus tachycardia;Normal sinus rhythm (04/25 1930) Resp:  [18] 18 (04/26 0436) BP: (115-153)/(67-92) 121/67 mmHg (04/26 0436) SpO2:  [93 %-94 %] 93 % (04/26 0436) Weight:  [278 lb (126.1 kg)] 278 lb (126.1 kg) (04/26 0436)  Intake/Output from previous day: 04/25 0701 - 04/26 0700 In: 192 [I.V.:192] Out: 450 [Urine:450]  General appearance: alert, cooperative and no distress Heart: irregularly irregular rhythm Lungs: clear to auscultation bilaterally Abdomen: soft, non-tender; bowel sounds normal; no masses,  no organomegaly Extremities: edema trace Wound: clean and dry  Lab Results:  Recent Labs  07/17/14 0510 07/18/14 0550  WBC 21.6* 20.0*  HGB 11.2* 11.0*  HCT 33.6* 32.5*  PLT 208 278   BMET:  Recent Labs  07/17/14 0510 07/18/14 0550  NA 132* 135  K 3.9 3.8  CL 99 100  CO2 22 22  GLUCOSE 101* 108*  BUN 9 11  CREATININE 0.83 0.80  CALCIUM 8.3* 8.5    PT/INR: No results for input(s): LABPROT, INR in the last 72 hours. ABG    Component Value Date/Time   PHART 7.385 07/15/2014 0336   HCO3 20.8 07/15/2014 0336   TCO2 19 07/15/2014 1719   ACIDBASEDEF 4.0* 07/15/2014 0336   O2SAT 96.0 07/15/2014 0336   CBG (last 3)   Recent Labs  07/17/14 1620 07/17/14 2116 07/18/14 0618  GLUCAP 99 115* 118*    Assessment/Plan: S/P Procedure(s) (LRB): CORONARY ARTERY BYPASS  GRAFTING (CABG), ON PUMP, TIMES FIVE, USING BILATERAL MAMMARY ARTERIES, RIGHT GREATER SAPHENOUS VEIN HARVESTED ENDOSCOPICALLY (N/A) TRANSESOPHAGEAL ECHOCARDIOGRAM (TEE) (N/A)  1. CV- A. Fib/NSR- on Amiodarone, Lopressor- if continues to have A. Fib, may need to consider adding anticoagulation 2. Pulm- no acute issues, continue IS 3. Renal- creatinine WNL, mild hypervolemia, continue Lasix for now 4.  ID- fever resolved, continued Leukocytosis, UA negative- on Levaquin 5. Dispo- patient with continued episodes of A. Fib- will continue Amiodarone, Lopressor- if continues may need anticoagulation, + leukocytosis on levaquin no obvious source of infection, continue current care   LOS: 9 days    King, Benjamin 07/18/2014  He is in SR with frequent PACs this AM Will change amiodarone to PO If a fib persists may have to consider anticoagulation His WBC remains elevated with no clear source but he has been coughing more frequently, could have bronchitis Will start empiric levaquin for 5 days  Viviann SpareSteven C. Dorris FetchHendrickson, MD [facility] [phone]

## 2014-07-18 NOTE — Discharge Summary (Signed)
Physician Discharge Summary  Patient ID: Philipp DeputyJay Answan Glaze MRN: [id number] DOB/AGE: 53/23/1962 53 y.o.  Admit date: 07/09/2014 Discharge date: 07/18/2014  Admission Diagnoses:  Patient Active Problem List   Diagnosis Date Noted  . ACS (acute coronary syndrome) 07/09/2014  . NSTEMI (non-ST elevated myocardial infarction) 07/09/2014  . HTN (hypertension) 07/09/2014  . Hyperlipidemia 07/09/2014   Discharge Diagnoses:   Patient Active Problem List   Diagnosis Date Noted  . S/P CABG (coronary artery bypass graft) 07/14/2014  . ACS (acute coronary syndrome) 07/09/2014  . NSTEMI (non-ST elevated myocardial infarction) 07/09/2014  . HTN (hypertension) 07/09/2014  . Hyperlipidemia 07/09/2014   Discharged Condition: good  History of Present Illness:  [name] is a 54 yo African American male with no known history of CAD.  He has multiple Cardiac risk factors including hypertension, hyperlipidemia, tobacco abuse, and positive family history of CAD.  He was in his usual state of health until approximately 1 week ago.  He was making a delivery when he developed fatigue and shortness of breath.  This was accompanied by pressure in his chest.  This episode only lasted a few minutes and then went away.  However, the morning of presentation he was awoken from sleep with severe chest pain.  He states he felt like someone was sitting on his chest.  This was accompanied by shortness of breath.  The pain got a little better but when he got out of bed it worsened prompting the patient to present to [facility] on 07[facility]09/2014 for evaluation.  In the ED his EKG was unremarkable.  His Troponin was mildly elevated.  He was placed on Heparin and transferred to [facility] for further workup.  He remained chest pain free during hospitalization.  He underwent Cardiac catheterization which showed severe 3 vessel CAD with a preserved EF.  It was felt he would require Coronary Bypass Grafting procedure and  TCTS consult was placed.  He was evaluated by Dr. Dorris FetchHendrickson who was in agreement the patient should undergo bypass surgery.  The risks and benefits of the procedure were explained to the patient and he was agreeable to proceed.      Hospital Course:   The patient was taken to the operating room on 07/14/2014.  He underwent CABG x 5 utilizing LIMA to LAD, Free RIMA to Anterolateral Diagnoal, Sequential SVG to PDA and PLVB, and SVG to OM2.  The patient underwent Endoscopic harvest of the greater saphenous vein on his right thigh.  He tolerated the procedure without difficulty and was taken to the SICU in stable condition.  During his stay in the SICU the patient was weaned and extubated in the early morning after surgery.  The patient's chest tubes and arterial lines were removed without difficulty.  He was maintaining NSR with occasional PAC's.  He was felt medically stable for transfer to the step down unit on POD #2.  The patient developed rapid Atrial Fibrillation and was treated with IV Lopressor and Amiodarone.  He continued to have bursts of rapid Atrial Fibrillation.  He currently in maintaining NSR and his IV Amiodarone has been transitioned to an oral regimen.  He has developed Leukocytosis.  However there has been no identifiable source of infection.  He was placed on empiric Levaquin for a five day course.  He is ambulating around the unit without difficulty. He is tolerating a heart healthy diet.  He is felt medically stable for discharge on 07/20/2014    Consults: cardiology  Significant  Diagnostic Studies: angiography:   Hemodynamic Findings: Central aortic pressure: 146/86 Left ventricular pressure: 141/6/13  Angiographic Findings:  Left main: No obstructive disease.   Left Anterior Descending Artery: Large caliber vessel that courses to the apex. The proximal vessel has mild plaque. The mid vessel is calcified and has diffuse 60-70% stenosis with hazy appearance in several views.  The distal vessel is relatively disease free until it approaches the apex. The apical LAD becomes small in caliber and has diffuse 90% stenosis. There is a large Diagonal branch that arises early from the proximal LAD and functions as an intermediate branch. This Diagonal/intermediated branch has a focal mid 99% stenosis followed by segment of 50% stenosis. There is a small caliber diagonal branch that arises from the mid LAD.   Circumflex Artery: Moderate caliber vessel with moderate caliber obtuse marginal branch. The obtuse marginal branch has mid serial 99% stenosis. The AV groove Circumflex becomes smaller in caliber and has diffuse 20% stenosis.   Right Coronary Artery: Large dominant vessel. The proximal and mid vessel has diffuse 20% stenosis. The distal vessel has a hazy 80% stenosis . The moderate caliber posterolateral branch has proximal 95% stenosis. The moderate caliber PDA has proximal 40% stenosis. The distal PDA becomes small in caliber and has diffuse 60% stenosis.   Left Ventricular Angiogram: LVEF=60-65%.   Treatments: surgery:   Median sternotomy, extracorporeal circulation, coronary artery bypass grafting x5 with bilateral mammary arteries (LIMA to LAD, free right internal mammary artery to anterolateral diagonal branch, saphenous vein graft to obtuse marginal 2, saphenous vein graft sequentially to posterior descending and posterior lateral), endoscopic vein harvest right leg, thigh.  Disposition: Home  Discharge medications:  The patient has been discharged on:   1.Beta Blocker:  Yes [ x  ]                              No   [   ]                              If No, reason:  2.Ace Inhibitor/ARB: Yes [ x ]                                     No  [    ]                                     If No, reason:  3.Statin:   Yes [ x  ]                  No  [   ]                  If No, reason:  4.Ecasa:  Yes  [x   ]                  No   [   ]                  If No,  reason:    Medication List    STOP taking these medications        amLODipine 10 MG tablet  Commonly known as:  NORVASC  atenolol 50 MG tablet  Commonly known as:  TENORMIN     hydrochlorothiazide 25 MG tablet  Commonly known as:  HYDRODIURIL      TAKE these medications        amiodarone 200 MG tablet  Commonly known as:  PACERONE  Take 2 tablets (400 mg total) by mouth 2 (two) times daily. For 7 Days, then decrease to 200 mg BID for 7 days, then decrease to 200 mg daily     aspirin 325 MG EC tablet  Take 1 tablet (325 mg total) by mouth daily.     furosemide 40 MG tablet  Commonly known as:  LASIX  Take 1 tablet (40 mg total) by mouth daily. For 5 Days     levofloxacin 500 MG tablet  Commonly known as:  LEVAQUIN  Take 1 tablet (500 mg total) by mouth daily. For 2 Days     lisinopril 5 MG tablet  Commonly known as:  PRINIVIL,ZESTRIL  Take 1 tablet (5 mg total) by mouth daily.     metoprolol 50 MG tablet  Commonly known as:  LOPRESSOR  Take 1 tablet (50 mg total) by mouth 2 (two) times daily.     MULTIVITAMIN ADULT PO  Take 1 tablet by mouth daily.     nicotine 14 mg/24hr patch  Commonly known as:  NICODERM CQ - dosed in mg/24 hours  Place 14 mg onto the skin daily.     oxyCODONE 5 MG immediate release tablet  Commonly known as:  Oxy IR/ROXICODONE  Take 1-2 tablets (5-10 mg total) by mouth every 3 (three) hours as needed for severe pain.     potassium chloride SA 20 MEQ tablet  Commonly known as:  K-DUR,KLOR-CON  Take 1 tablet (20 mEq total) by mouth daily. For 5 days     simvastatin 20 MG tablet  Commonly known as:  ZOCOR  Take 1 tablet (20 mg total) by mouth daily at 6 PM.         Follow-up Information    Follow up with Loreli Slot, MD On 08/22/2014.   Specialty:  Cardiothoracic Surgery   Why:  Appointment is at 11:00   Contact information:   [address] [place] [address] [phone]       Follow up with  [facility] On 08/22/2014.   Why:  Please get CXR at 10:30   Contact information:   [facility]       Follow up with [name], MD On 08/02/2014.   Specialty:  Cardiology   Why:  @ 9:20am   Contact information:   [address] [place] [address] [phone]       Signed: Lowella Dandy 07/18/2014, 10:37 AM

## 2014-07-18 NOTE — Progress Notes (Signed)
    Subjective:  The patient is feeling better. He is sitting up in bed eating lunch. He denies chest pain, heart palpitations, or shortness of breath.  Objective:  Vital Signs in the last 24 hours: Temp:  [98.5 F (36.9 C)-99.7 F (37.6 C)] 98.5 F (36.9 C) (04/26 0436) Pulse Rate:  [92-152] 101 (04/26 1006) Resp:  [18] 18 (04/26 0436) BP: (115-153)/(67-92) 152/87 mmHg (04/26 1006) SpO2:  [93 %-94 %] 93 % (04/26 0436) Weight:  [278 lb (126.1 kg)] 278 lb (126.1 kg) (04/26 0436)  Intake/Output from previous day: 04/25 0701 - 04/26 0700 In: 192 [I.V.:192] Out: 450 [Urine:450]  Lab Results:  Recent Labs  07/17/14 0510 07/18/14 0550  WBC 21.6* 20.0*  HGB 11.2* 11.0*  PLT 208 278    Recent Labs  07/17/14 0510 07/18/14 0550  NA 132* 135  K 3.9 3.8  CL 99 100  CO2 22 22  GLUCOSE 101* 108*  BUN 9 11  CREATININE 0.83 0.80   No results for input(s): TROPONINI in the last 72 hours.  Invalid input(s): CK, MB  Tele: Sinus rhythm, frequent PACs, short runs of atrial fibrillation with RVR.  Assessment/Plan:  1. Non-STEMI with multivessel coronary artery disease, now postoperative day #4 from multivessel CABG. 2. Postoperative atrial fibrillation, transitioned from IV to oral amiodarone today 3. Essential hypertension 4. Hyperlipidemia 5. Tobacco abuse  Patient appears stable. He has frequent PACs and short runs of atrial fibrillation even on amiodarone and metoprolol. I think he will probably remain at high risk of atrial fibrillation during the postoperative period. It may be best to get him on oral anticoagulation for a few months. Will discuss with Dr. Dorris FetchHendrickson. I can make arrangements so that he has follow-up with [facility] in [place]. [place] to [place] in [place] where he has more family support, but he is ok for office visits in [place] or [place].  Tonny BollmanMichael Dante Roudebush, M.D. 07/18/2014, 12:26 PM

## 2014-07-19 LAB — BASIC METABOLIC PANEL
ANION GAP: 11 (ref 5–15)
BUN: 9 mg/dL (ref 6–23)
CO2: 22 mmol/L (ref 19–32)
CREATININE: 0.76 mg/dL (ref 0.50–1.35)
Calcium: 8.7 mg/dL (ref 8.4–10.5)
Chloride: 101 mmol/L (ref 96–112)
GFR calc Af Amer: >90 mL/min (ref >90)
GFR calc non Af Amer: >90 mL/min (ref >90)
Glucose, Bld: 110 mg/dL — ABNORMAL HIGH (ref 70–99)
Potassium: 3.6 mmol/L (ref 3.5–5.1)
Sodium: 134 mmol/L — ABNORMAL LOW (ref 135–145)

## 2014-07-19 LAB — CBC
HCT: 33.8 % — ABNORMAL LOW (ref 39.0–52.0)
HEMOGLOBIN: 11.5 g/dL — AB (ref 13.0–17.0)
MCH: 30.1 pg (ref 26.0–34.0)
MCHC: 34 g/dL (ref 30.0–36.0)
MCV: 88.5 fL (ref 78.0–100.0)
Platelets: 356 10*3/uL (ref 150–400)
RBC: 3.82 MIL/uL — ABNORMAL LOW (ref 4.22–5.81)
RDW: 13.7 % (ref 11.5–15.5)
WBC: 16.3 10*3/uL — ABNORMAL HIGH (ref 4.0–10.5)

## 2014-07-19 MED ORDER — BISACODYL 10 MG RE SUPP: 10.0000 mg | Freq: Every day | RECTAL | Status: DC | PRN

## 2014-07-19 MED ORDER — BISACODYL 5 MG PO TBEC: 10.0000 mg | DELAYED_RELEASE_TABLET | Freq: Every day | ORAL | Status: DC | PRN

## 2014-07-19 MED ORDER — METOPROLOL TARTRATE 50 MG PO TABS
50.0000 mg | ORAL_TABLET | Freq: Two times a day (BID) | ORAL | Status: DC
  Administered 2014-07-19 – 2014-07-20 (×2): 50 mg via ORAL
  Filled 2014-07-19 (×5): qty 1

## 2014-07-19 NOTE — Care Management (Signed)
Per MD request- benefits check done on Xarelto vs Eliquis S/W CHRISTINE @ [employer] [facility] # [phone]  *XAERLTO 20 MG DAILT FOR 30 DAYS COVER-YES CO-PAY- $ 30.00 TIER- 2 DRUG PRIOR APPROVAL- NO PHARMACY- ANY RETAIL   * ELIQUIS 5MG  BID FOR 30 DAYS COVER-YES CO-PAY- $ 30.00 TIER- 2 DRUG PRIOR APPROVAL - NO PHARMACY - ANY RETAIL

## 2014-07-19 NOTE — Progress Notes (Signed)
CARDIAC REHAB PHASE I   PRE:  Rate/Rhythm: 98 SR PACs  BP:  Supine:   Sitting: 108/78  Standing:    SaO2: 95%RA  MODE:  Ambulation: 750 ft   POST:  Rate/Rhythm: 107 ST PACs  BP:  Supine:   Sitting: 130/68  Standing:    SaO2: 97%RA 1115-1140 Pt walked 750 ft on RA with rolling walker with steady gait. Tolerated increase in distance well. To recliner after walk.    Benjamin Nuttingharlene Jasamine Pottinger, RN BSN  07/19/2014 11:36 AM

## 2014-07-19 NOTE — Progress Notes (Addendum)
    Subjective:  Feeling well. No CP or SOB  Objective:  Vital Signs in the last 24 hours: Temp:  [97.5 F (36.4 C)-98.9 F (37.2 C)] 98.3 F (36.8 C) (04/27 0612) Pulse Rate:  [95-101] 98 (04/27 0944) Resp:  [18] 18 (04/27 0612) BP: (120-160)/(72-78) 120/72 mmHg (04/27 1002) SpO2:  [94 %-96 %] 94 % (04/27 0612) Weight:  [274 lb 11.1 oz (124.6 kg)] 274 lb 11.1 oz (124.6 kg) (04/27 0612)  Intake/Output from previous day: 04/26 0701 - 04/27 0700 In: 483 [P.O.:480; I.V.:3] Out: -   Lab Results:  Recent Labs  07/18/14 0550 07/19/14 0450  WBC 20.0* 16.3*  HGB 11.0* 11.5*  PLT 278 356    Recent Labs  07/18/14 0550 07/19/14 0450  NA 135 134*  K 3.8 3.6  CL 100 101  CO2 22 22  GLUCOSE 108* 110*  BUN 11 9  CREATININE 0.80 0.76   No results for input(s): TROPONINI in the last 72 hours.  Invalid input(s): CK, MB  Tele: Sinus rhythm, frequent PACs  Assessment/Plan:  1. Non-STEMI with multivessel coronary artery disease, now postoperative day #4 from multivessel CABG. 2. Postoperative atrial fibrillation, transitioned from IV to oral amiodarone yesterday. Currently on 400 mg BID and lopressor 50mg  BID. Maintaining NSR currently. Has not had afib since 05/18/14. 3. Essential hypertension 4. Hyperlipidemia 5. Tobacco abuse   I have made arrangements for ollow-up with [facility] in [place]. [place] to [place] in [place], [place] where he has more family support, but he is ok for office visits in [place]. Cardiology can likely sign off today.   Janetta HoraHOMPSON, KATHRYN R, PA-C 07/19/2014, 10:08 AM  Patient seen, examined. Available data reviewed. Agree with findings, assessment, and plan as outlined by Cline CrockKathryn Thompson, PA-C. The patient is independently interviewed and examined. Heart is regular rate and rhythm. Lung fields are clear. I have reviewed telemetry, which shows no further atrial fibrillation over the last 24 hours. The last clear episode of atrial  fibrillation that I see was April 25 in the evening. If he remains in sinus rhythm over the next 24 hours, it may be reasonable to discharge him on aspirin alone. Would plan on continuing amiodarone for 2 months post CABG. Follow-up arrangements in [place] have been made.  Tonny BollmanMichael Dameian Crisman, M.D. 07/19/2014 10:43 AM

## 2014-07-19 NOTE — Progress Notes (Signed)
5 Days Post-Op Procedure(s) (LRB): CORONARY ARTERY BYPASS GRAFTING (CABG), ON PUMP, TIMES FIVE, USING BILATERAL MAMMARY ARTERIES, RIGHT GREATER SAPHENOUS VEIN HARVESTED ENDOSCOPICALLY (N/A) TRANSESOPHAGEAL ECHOCARDIOGRAM (TEE) (N/A) Subjective: Feels better today Had multiple BM overnight after lactulose  Objective: Vital signs in last 24 hours: Temp:  [97.5 F (36.4 C)-98.9 F (37.2 C)] 98.3 F (36.8 C) (04/27 0612) Pulse Rate:  [95-101] 100 (04/27 0612) Cardiac Rhythm:  [-] Sinus tachycardia;Normal sinus rhythm (04/27 0742) Resp:  [18] 18 (04/27 0612) BP: (124-160)/(72-87) 129/77 mmHg (04/27 0612) SpO2:  [94 %-96 %] 94 % (04/27 0612) Weight:  [274 lb 11.1 oz (124.6 kg)] 274 lb 11.1 oz (124.6 kg) (04/27 0612)  Hemodynamic parameters for last 24 hours:    Intake/Output from previous day: 04/26 0701 - 04/27 0700 In: 483 [P.O.:480; I.V.:3] Out: -  Intake/Output this shift:    General appearance: alert and no distress Neurologic: intact Heart: regular rate and rhythm Lungs: diminished breath sounds bibasilar Abdomen: less distended, nontender Wound: clean and dry  Lab Results:  Recent Labs  07/18/14 0550 07/19/14 0450  WBC 20.0* 16.3*  HGB 11.0* 11.5*  HCT 32.5* 33.8*  PLT 278 356   BMET:  Recent Labs  07/18/14 0550 07/19/14 0450  NA 135 134*  K 3.8 3.6  CL 100 101  CO2 22 22  GLUCOSE 108* 110*  BUN 11 9  CREATININE 0.80 0.76  CALCIUM 8.5 8.7    PT/INR: No results for input(s): LABPROT, INR in the last 72 hours. ABG    Component Value Date/Time   PHART 7.385 07/15/2014 0336   HCO3 20.8 07/15/2014 0336   TCO2 19 07/15/2014 1719   ACIDBASEDEF 4.0* 07/15/2014 0336   O2SAT 96.0 07/15/2014 0336   CBG (last 3)   Recent Labs  07/17/14 1620 07/17/14 2116 07/18/14 0618  GLUCAP 99 115* 118*    Assessment/Plan: S/P Procedure(s) (LRB): CORONARY ARTERY BYPASS GRAFTING (CABG), ON PUMP, TIMES FIVE, USING BILATERAL MAMMARY ARTERIES, RIGHT GREATER  SAPHENOUS VEIN HARVESTED ENDOSCOPICALLY (N/A) TRANSESOPHAGEAL ECHOCARDIOGRAM (TEE) (N/A) -  CV- maintaining SR but still a lot of PACs- will increase metoprolol to 50 mg BID  Continue amiodarone  May need anticoagulation, i.e pradaxa  Dc pacing wires  RESP- stable  RENAL- creatinine and lytes OK, continue lasix and KCl  ENDO- CBG OK  Leukocytosis- improving with levaquin  Ileus- improved post lactulose  May be ready for dc tomorrow if he continues to progress   LOS: 10 days    Loreli SlotSteven C Brieanna King 07/19/2014

## 2014-07-19 NOTE — Progress Notes (Signed)
Per Dr. Magdalene Mollyrder pacer wires pulled after pt educated regarding procedure. V/S taken pre-procedure and Q 15 min x4 and Q30 x 4. Pt tolerated well, wires intact and bandaide placed. Will continue to monitor. Hiram Combererek Kimeka Badour RN

## 2014-07-19 NOTE — Progress Notes (Signed)
Received report from SPX CorporationDerrick RN. Pt transferred from 2W23 to 2W39. Pt alert and oriented x4, VSS, no s/s of respiratory distress on room air, denies any pain at this time. Call bell within reach, will continue to monitor closely.

## 2014-07-19 NOTE — Progress Notes (Signed)
Encouraged pt to take a walk this morning. Pt stated that he was in too much pain and that he would like to wait a while. RN will cont to monitor.

## 2014-07-20 DIAGNOSIS — Z736 Limitation of activities due to disability: Secondary | ICD-10-CM

## 2014-07-20 LAB — CBC
HEMATOCRIT: 30.6 % — AB (ref 39.0–52.0)
Hemoglobin: 10.2 g/dL — ABNORMAL LOW (ref 13.0–17.0)
MCH: 29.5 pg (ref 26.0–34.0)
MCHC: 33.3 g/dL (ref 30.0–36.0)
MCV: 88.4 fL (ref 78.0–100.0)
Platelets: 392 10*3/uL (ref 150–400)
RBC: 3.46 MIL/uL — ABNORMAL LOW (ref 4.22–5.81)
RDW: 13.7 % (ref 11.5–15.5)
WBC: 14.1 10*3/uL — ABNORMAL HIGH (ref 4.0–10.5)

## 2014-07-20 MED ORDER — POTASSIUM CHLORIDE CRYS ER 20 MEQ PO TBCR: 20.0000 meq | EXTENDED_RELEASE_TABLET | Freq: Every day | ORAL | Status: DC

## 2014-07-20 MED ORDER — LEVOFLOXACIN 500 MG PO TABS: 500.0000 mg | ORAL_TABLET | Freq: Every day | ORAL | Status: DC

## 2014-07-20 MED ORDER — ASPIRIN 325 MG PO TBEC: 325.0000 mg | DELAYED_RELEASE_TABLET | Freq: Every day | ORAL | Status: DC

## 2014-07-20 MED ORDER — FUROSEMIDE 40 MG PO TABS: 40.0000 mg | ORAL_TABLET | Freq: Every day | ORAL | Status: DC

## 2014-07-20 MED ORDER — METOPROLOL TARTRATE 50 MG PO TABS: 50.0000 mg | ORAL_TABLET | Freq: Two times a day (BID) | ORAL | Status: DC

## 2014-07-20 MED ORDER — AMIODARONE HCL 200 MG PO TABS: 400.0000 mg | ORAL_TABLET | Freq: Two times a day (BID) | ORAL | Status: DC

## 2014-07-20 MED ORDER — LISINOPRIL 5 MG PO TABS: 5.0000 mg | ORAL_TABLET | Freq: Every day | ORAL | Status: DC

## 2014-07-20 MED ORDER — OXYCODONE HCL 5 MG PO TABS: 5.0000 mg | ORAL_TABLET | ORAL | Status: DC | PRN

## 2014-07-20 MED ORDER — SIMVASTATIN 20 MG PO TABS: 20.0000 mg | ORAL_TABLET | Freq: Every day | ORAL | Status: DC

## 2014-07-20 NOTE — Progress Notes (Signed)
Sutures removed from abd per dr order, Sinda Dusteri strips applied pt tolerated well.

## 2014-07-20 NOTE — Care Management Note (Signed)
    Page 1 of 2   07/20/2014     2:55:12 PM CARE MANAGEMENT NOTE 07/20/2014  Patient:  Philipp DeputyHYLTON,Cosimo ANSWAN   Account Number:  [id number]  Date Initiated:  07/10/2014  Documentation initiated by:  [facility] LPHAVIS,ALESIA  Subjective/Objective Assessment:   chest pain, NSTEMI     Action/Plan:   lives at home with wife   Anticipated DC Date:  07/21/2014   Anticipated DC Plan:  HOME/SELF CARE      DC Planning Services  CM consult      Choice offered to / List presented to:             Status of service:  Completed, signed off Medicare Important Message given?  NO (If response is "NO", the following Medicare IM given date fields will be blank) Date Medicare IM given:   Medicare IM given by:   Date Additional Medicare IM given:   Additional Medicare IM given by:    Discharge Disposition:  HOME/SELF CARE  Per UR Regulation:  Reviewed for med. necessity/level of care/duration of stay  If discussed at Long Length of Stay Meetings, dates discussed:   07/18/2014  07/20/2014    Comments:  07/18/14- 1500- Donn PieriniKristi Jaxyn Rout RN, BSN [phone] per MD request benefits check completed for Xarelto vs Eliquis S/W CHRISTINE @ [employer] # [phone]  *XAERLTO 20 MG DAILT FOR 30 DAYS COVER-YES CO-PAY- $ 30.00 TIER- 2 DRUG PRIOR APPROVAL- NO PHARMACY- ANY RETAIL   * ELIQUIS 5MG  BID FOR 30 DAYS COVER-YES CO-PAY- $ 30.00 TIER- 2 DRUG PRIOR APPROVAL - NO PHARMACY - ANY RETAIL    07/13/14- 1400- Sander RadonKristi Vonette Grosso rN, bSN [phone] Pt for CABG in am, NCM to follow post op for any d/c needs  07/10/2014 1655 Chart reviewed. Waiting final recommendations for home. NCM will continue to follow for dc needs. Isidoro DonningAlesia Shavis RN CCM Case Mgmt phone [phone]

## 2014-07-20 NOTE — Discharge Instructions (Signed)
Coronary Artery Bypass Grafting, Care After °Refer to this sheet in the next few weeks. These instructions provide you with information on caring for yourself after your procedure. Your health care provider may also give you more specific instructions. Your treatment has been planned according to current medical practices, but problems sometimes occur. Call your health care provider if you have any problems or questions after your procedure. °WHAT TO EXPECT AFTER THE PROCEDURE °Recovery from surgery will be different for everyone. Some people feel well after 3 or 4 weeks, while for others it takes longer. After your procedure, it is typical to have the following: °· Nausea and a lack of appetite.   °· Constipation. °· Weakness and fatigue.   °· Depression or irritability.   °· Pain or discomfort at your incision site. °HOME CARE INSTRUCTIONS °· Take medicines only as directed by your health care provider. Do not stop taking medicines or start any new medicines without first checking with your health care provider. °· Take your pulse as directed by your health care provider. °· Perform deep breathing as directed by your health care provider. If you were given a device called an incentive spirometer, use it to practice deep breathing several times a day. Support your chest with a pillow or your arms when you take deep breaths or cough. °· Keep incision areas clean, dry, and protected. Remove or change any bandages (dressings) only as directed by your health care provider. You may have skin adhesive strips over the incision areas. Do not take the strips off. They will fall off on their own. °· Check incision areas daily for any swelling, redness, or drainage. °· If incisions were made in your legs, do the following: °¨ Avoid crossing your legs.   °¨ Avoid sitting for long periods of time. Change positions every 30 minutes.   °¨ Elevate your legs when you are sitting. °· Wear compression stockings as directed by your  health care provider. These stockings help keep blood clots from forming in your legs. °· Take showers once your health care provider approves. Until then, only take sponge baths. Pat incisions dry. Do not rub incisions with a washcloth or towel. Do not take baths, swim, or use a hot tub until your health care provider approves. °· Eat foods that are high in fiber, such as raw fruits and vegetables, whole grains, beans, and nuts. Meats should be lean cut. Avoid canned, processed, and fried foods. °· Drink enough fluid to keep your urine clear or pale yellow. °· Weigh yourself every day. This helps identify if you are retaining fluid that may make your heart and lungs work harder. °· Rest and limit activity as directed by your health care provider. You may be instructed to: °¨ Stop any activity at once if you have chest pain, shortness of breath, irregular heartbeats, or dizziness. Get help right away if you have any of these symptoms. °¨ Move around frequently for short periods or take short walks as directed by your health care provider. Increase your activities gradually. You may need physical therapy or cardiac rehabilitation to help strengthen your muscles and build your endurance. °¨ Avoid lifting, pushing, or pulling anything heavier than 10 lb (4.5 kg) for at least 6 weeks after surgery. °· Do not drive until your health care provider approves.  °· Ask your health care provider when you may return to work. °· Ask your health care provider when you may resume sexual activity. °· Keep all follow-up visits as directed by your health care   provider. This is important. °SEEK MEDICAL CARE IF: °· You have swelling, redness, increasing pain, or drainage at the site of an incision. °· You have a fever. °· You have swelling in your ankles or legs. °· You have pain in your legs.   °· You gain 2 or more pounds (0.9 kg) a day. °· You are nauseous or vomit. °· You have diarrhea.  °SEEK IMMEDIATE MEDICAL CARE IF: °· You have  chest pain that goes to your jaw or arms. °· You have shortness of breath.   °· You have a fast or irregular heartbeat.   °· You notice a "clicking" in your breastbone (sternum) when you move.   °· You have numbness or weakness in your arms or legs. °· You feel dizzy or light-headed.   °MAKE SURE YOU: °· Understand these instructions. °· Will watch your condition. °· Will get help right away if you are not doing well or get worse. °Document Released: 09/27/2004 Document Revised: 07/25/2013 Document Reviewed: 08/17/2012 °ExitCare® Patient Information ©2015 [employer]. This information is not intended to replace advice given to you by your health care provider. Make sure you discuss any questions you have with your health care provider. ° °Endoscopic Saphenous Vein Harvesting °Care After °Refer to this sheet in the next few weeks. These instructions provide you with information on caring for yourself after your procedure. Your health care provider may also give you more specific instructions. Your treatment has been planned according to current medical practices, but problems sometimes occur. Call your health care provider if you have any problems or questions after your procedure. °HOME CARE INSTRUCTIONS °Medicine °· Take whatever pain medicine your surgeon prescribes. Follow the directions carefully. Do not take over-the-counter pain medicine unless your surgeon says it is okay. Some pain medicine can cause bleeding problems for several weeks after surgery. °· Follow your surgeon's instructions about driving. You will probably not be permitted to drive after heart surgery. °· Take any medicines your surgeon prescribes. Any medicines you took before your heart surgery should be checked with your health care provider before you start taking them again. °Wound care °· If your surgeon has prescribed an elastic bandage or stocking, ask how long you should wear it. °· Check the area around your surgical cuts  (incisions) whenever your bandages (dressings) are changed. Look for any redness or swelling. °· You will need to return to have the stitches (sutures) or staples taken out. Ask your surgeon when to do that. °· Ask your surgeon when you can shower or bathe. °Activity °· Try to keep your legs raised when you are sitting. °· Do any exercises your health care providers have given you. These may include deep breathing exercises, coughing, walking, or other exercises. °SEEK MEDICAL CARE IF: °· You have any questions about your medicines. °· You have more leg pain, especially if your pain medicine stops working. °· New or growing bruises develop on your leg. °· Your leg swells, feels tight, or becomes red. °· You have numbness in your leg. °SEEK IMMEDIATE MEDICAL CARE IF: °· Your pain gets much worse. °· Blood or fluid leaks from any of the incisions. °· Your incisions become warm, swollen, or red. °· You have chest pain. °· You have trouble breathing. °· You have a fever. °· You have more pain near your leg incision. °MAKE SURE YOU: °· Understand these instructions. °· Will watch your condition. °· Will get help right away if you are not doing well or get worse. °Document Released: 11/20/2010   Document Revised: 03/15/2013 Document Reviewed: 11/20/2010 °ExitCare® Patient Information ©2015 [employer]. This information is not intended to replace advice given to you by your health care provider. Make sure you discuss any questions you have with your health care provider. ° ° °

## 2014-07-20 NOTE — Progress Notes (Addendum)
      [address]       [place] [id number]             [phone]      6 Days Post-Op Procedure(s) (LRB): CORONARY ARTERY BYPASS GRAFTING (CABG), ON PUMP, TIMES FIVE, USING BILATERAL MAMMARY ARTERIES, RIGHT GREATER SAPHENOUS VEIN HARVESTED ENDOSCOPICALLY (N/A) TRANSESOPHAGEAL ECHOCARDIOGRAM (TEE) (N/A)   Subjective:  [name] has no complaints.  He is ready to go home.  + BM  Objective: Vital signs in last 24 hours: Temp:  [98.1 F (36.7 C)-98.5 F (36.9 C)] 98.1 F (36.7 C) (04/28 0556) Pulse Rate:  [89-102] 102 (04/28 0556) Cardiac Rhythm:  [-] Sinus tachycardia;Normal sinus rhythm (04/27 2100) Resp:  [18] 18 (04/28 0556) BP: (100-140)/(59-84) 139/84 mmHg (04/28 0556) SpO2:  [94 %-96 %] 94 % (04/28 0556) Weight:  [275 lb 1.6 oz (124.785 kg)] 275 lb 1.6 oz (124.785 kg) (04/28 0556)  Intake/Output from previous day: 04/27 0701 - 04/28 0700 In: 120 [P.O.:120] Out: -   General appearance: alert, cooperative and no distress Heart: regular rate and rhythm Lungs: clear to auscultation bilaterally Abdomen: soft, non-tender; bowel sounds normal; no masses,  no organomegaly Extremities: extremities normal, atraumatic, no cyanosis or edema Wound: clean and dry  Lab Results:  Recent Labs  07/19/14 0450 07/20/14 0408  WBC 16.3* 14.1*  HGB 11.5* 10.2*  HCT 33.8* 30.6*  PLT 356 392   BMET:  Recent Labs  07/18/14 0550 07/19/14 0450  NA 135 134*  K 3.8 3.6  CL 100 101  CO2 22 22  GLUCOSE 108* 110*  BUN 11 9  CREATININE 0.80 0.76  CALCIUM 8.5 8.7    PT/INR: No results for input(s): LABPROT, INR in the last 72 hours. ABG    Component Value Date/Time   PHART 7.385 07/15/2014 0336   HCO3 20.8 07/15/2014 0336   TCO2 19 07/15/2014 1719   ACIDBASEDEF 4.0* 07/15/2014 0336   O2SAT 96.0 07/15/2014 0336   CBG (last 3)   Recent Labs  07/17/14 1620 07/17/14 2116 07/18/14 0618  GLUCAP 99 115* 118*    Assessment/Plan: S/P Procedure(s)  (LRB): CORONARY ARTERY BYPASS GRAFTING (CABG), ON PUMP, TIMES FIVE, USING BILATERAL MAMMARY ARTERIES, RIGHT GREATER SAPHENOUS VEIN HARVESTED ENDOSCOPICALLY (N/A) TRANSESOPHAGEAL ECHOCARDIOGRAM (TEE) (N/A)  1. CV- Previous A. Fib (resolved 4/25), maintaining NSR with PVCs- continue Amiodarone, Lopressor, Lisinopril 2. Pulm- off oxygen, encouraged use of IS at discharge 3. Renal- volume status is stable on Lasix 4. ID- Leukocytosis improving, continue levaquin 5. Dispo- patient is stable, will d/c today  LOS: 11 days    BARRETT, [name] 07/20/2014  No further a fib. I think it is safe to dc with just aspirin  Viviann SpareSteven C. Dorris FetchHendrickson, MD [facility] [phone]

## 2014-07-20 NOTE — Progress Notes (Signed)
CARDIAC REHAB PHASE I    Pt going home today. [facility] education completed. Pt verbalized understanding. Pt wife at bedside. Encouraged IS, reviewed sternal precautions, restrictions, exercise guidelines, heart healthy diet, and phase 2. Pt agrees to phase 2 cardiac rehab. [facility].   [id number]  Joylene GrapesMonge, Earnestine Tuohey C, RN, BSN 07/20/2014 10:22 AM

## 2014-07-21 LAB — URINE CULTURE
COLONY COUNT: NO GROWTH
Culture: NO GROWTH
Special Requests: NORMAL

## 2014-08-02 ENCOUNTER — Ambulatory Visit (INDEPENDENT_AMBULATORY_CARE_PROVIDER_SITE_OTHER): Payer: 59 | Admitting: Cardiology

## 2014-08-02 ENCOUNTER — Encounter: Payer: Self-pay | Admitting: Cardiology

## 2014-08-02 VITALS — BP 128/88 | HR 68 | Ht 75.0 in | Wt 276.0 lb

## 2014-08-02 DIAGNOSIS — Z951 Presence of aortocoronary bypass graft: Secondary | ICD-10-CM | POA: Diagnosis not present

## 2014-08-02 DIAGNOSIS — I2510 Atherosclerotic heart disease of native coronary artery without angina pectoris: Secondary | ICD-10-CM

## 2014-08-02 DIAGNOSIS — I10 Essential (primary) hypertension: Secondary | ICD-10-CM | POA: Diagnosis not present

## 2014-08-02 DIAGNOSIS — I4891 Unspecified atrial fibrillation: Secondary | ICD-10-CM

## 2014-08-02 DIAGNOSIS — I9789 Other postprocedural complications and disorders of the circulatory system, not elsewhere classified: Secondary | ICD-10-CM

## 2014-08-02 NOTE — Patient Instructions (Signed)
Your physician recommends that you continue on your current medications as directed. Please refer to the Current Medication list given to you today.  Your physician recommends that you schedule a follow-up appointment in: 2 months 

## 2014-08-02 NOTE — Progress Notes (Signed)
Cardiology Office Note  Date: 08/02/2014   ID: Benjamin DeputyJay Answan Isakson, DOB November 02, 1960, MRN [id number]  [facility] [place] Primary Cardiologist: Nona DellSamuel Melizza Kanode, MD    Chief Complaint  Patient presents with  . Hospitalization Follow-up  . Coronary Artery Disease    History of Present Illness: Benjamin King is a 54 y.o. male presenting for a post hospital follow-up, this is our first meeting. I reviewed his hospital records. [facility] April with evidence of NSTEMI and was [facility]. He underwent cardiac catheterization demonstrating multivessel CAD and was seen by Dr. Dorris FetchHendrickson for surgical revascularization. He underwent CABG including LIMA to LAD, free RIMA to anterolateral diagnoal, sequential SVG to PDA and PLVB, and SVG to OM2. Postoperative course was notable for intermittent rapid atrial fibrillation which was managed with beta blocker and amiodarone.  He presents today for a follow-up visit with his wife. He states that he has been gradually getting better each day, has a good appetite, sleep has been improving. He has been walking regularly. Seems to be interested in cardiac rehabilitation. [place] lives in [place], [place].  Medications are reviewed below. He will be transitioning to amiodarone 200 mg once daily as of tomorrow. He does not endorse any palpitations.  I do not see that he had a complete echocardiogram done during his hospital stay. LVEF was reported in the range of 60-65% by LV angiography.  He will be seeing Dr. Dorris FetchHendrickson later this month.   Past Medical History  Diagnosis Date  . Essential hypertension   . Hyperlipidemia   . CAD (coronary artery disease)     Multivessel status post CABG April 2016   . NSTEMI (non-ST elevated myocardial infarction)     April 2016     Past Surgical History  Procedure Laterality Date  . Right knee surgery      Trauma  . Left heart catheterization with  coronary angiogram N/A 07/10/2014    Procedure: LEFT HEART CATHETERIZATION WITH CORONARY ANGIOGRAM;  Surgeon: Corky CraftsJayadeep S Varanasi, MD;  [facility];  Service: Cardiovascular;  Laterality: N/A;  . Coronary artery bypass graft N/A 07/14/2014    Procedure: CORONARY ARTERY BYPASS GRAFTING (CABG), ON PUMP, TIMES FIVE, USING BILATERAL MAMMARY ARTERIES, RIGHT GREATER SAPHENOUS VEIN HARVESTED ENDOSCOPICALLY;  Surgeon: Loreli SlotSteven C Hendrickson, MD;  [facility];  Service: Open Heart Surgery;  Laterality: N/A;  Bilateral Mammary Arteries  . Tee without cardioversion N/A 07/14/2014    Procedure: TRANSESOPHAGEAL ECHOCARDIOGRAM (TEE);  Surgeon: Loreli SlotSteven C Hendrickson, MD;  [facility];  Service: Open Heart Surgery;  Laterality: N/A;    Current Outpatient Prescriptions  Medication Sig Dispense Refill  . amiodarone (PACERONE) 200 MG tablet Take 2 tablets (400 mg total) by mouth 2 (two) times daily. For 7 Days, then decrease to 200 mg BID for 7 days, then decrease to 200 mg daily 90 tablet 1  . aspirin 81 MG tablet Take 81 mg by mouth daily.    Marland Kitchen. lisinopril (PRINIVIL,ZESTRIL) 5 MG tablet Take 1 tablet (5 mg total) by mouth daily. 30 tablet 3  . metoprolol (LOPRESSOR) 50 MG tablet Take 1 tablet (50 mg total) by mouth 2 (two) times daily. 60 tablet 3  . Multiple Vitamins-Minerals (MULTIVITAMIN ADULT PO) Take 1 tablet by mouth daily.    . simvastatin (ZOCOR) 20 MG tablet Take 1 tablet (20 mg total) by mouth daily at 6 PM. 30 tablet 3   No current facility-administered medications  for this visit.    Allergies:  Review of patient's allergies indicates no known allergies.   Social History: The patient  reports that he has quit smoking. His smoking use included Cigars. He has never used smokeless tobacco. He reports that he does not drink alcohol or use illicit drugs.   Family History: The patient's family history includes Clotting disorder in his father; Coronary artery disease in his mother.   ROS:   Please see the history of present illness. Otherwise, complete review of systems is positive for improving leg edema. No orthopnea or PND.  All other systems are reviewed and negative.   Physical Exam: VS:  BP 128/88 mmHg  Pulse 68  Ht 6\' 3"  (1.905 m)  Wt 276 lb (125.193 kg)  BMI 34.50 kg/m2  SpO2 98%, BMI Body mass index is 34.5 kg/(m^2).  Wt Readings from Last 3 Encounters:  08/02/14 276 lb (125.193 kg)  07/20/14 275 lb 1.6 oz (124.785 kg)     General: Tall male, appears comfortable at rest. HEENT: Conjunctiva and lids normal, oropharynx clear. Neck: Supple, no elevated JVP or carotid bruits, no thyromegaly. Thorax: Well-healing sternal incision without erythema or drainage. Lungs: Clear to auscultation except decreased at the very bases, nonlabored breathing at rest. Cardiac: Regular rate and rhythm, no S3 or significant systolic murmur, no pericardial rub. Abdomen: Soft, nontender, healing keyhole incisions with Steri-Strips, bowel sounds present, no guarding or rebound. Extremities: Trace ankle edema, distal pulses 2+. Skin: Warm and dry. Musculoskeletal: No kyphosis. Neuropsychiatric: Alert and oriented x3, affect grossly appropriate.   ECG: ECG is not ordered today.   Recent Labwork: 07/15/2014: Magnesium 2.1 07/19/2014: BUN 9; Creatinine 0.76; Potassium 3.6; Sodium 134* 07/20/2014: Hemoglobin 10.2*; Platelets 392     Component Value Date/Time   CHOL 117 07/10/2014 0322   TRIG 190* 07/10/2014 0322   HDL 26* 07/10/2014 0322   CHOLHDL 4.5 07/10/2014 0322   VLDL 38 07/10/2014 0322   LDLCALC 53 07/10/2014 0322    Other Studies Reviewed Today:  Chest x-ray 07/17/2014: FINDINGS: Right IJ sheath and left chest tube have been removed. Six intact median sternotomy wires are again seen. Lung volumes are low with mild basilar atelectasis and there are small bilateral pleural effusions. No pneumothorax or pulmonary edema.  IMPRESSION: Negative for pneumothorax after  chest tube removal.  Small bilateral pleural effusions and bibasilar atelectasis.   Assessment and Plan:  1. Multivessel CAD status post NSTEMI in April, now status post CABG as outlined above, LVEF preserved. Plan is to continue current medical regimen, keep follow-up with Dr. Dorris FetchHendrickson later this month. Ultimately anticipate pursuing cardiac rehabilitation. 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  2. Postoperative atrial fibrillation, no palpitations and heart rate regular today. Continue amiodarone, we will recheck ECG at next visit in one month, and can likely discontinue antiarrhythmic therapy at that time if he remains in sinus rhythm.  3. Lipid panel in April showing LDL 53, he continues on statin therapy.  4. Essential hypertension, blood pressure control is good today.  Current medicines were reviewed with the patient today.   Disposition: FU with me in 1 month.   Signed, Jonelle SidleSamuel G. Kortny Lirette, MD, [facility] 08/02/2014 10:01 AM    [facility] [address], [place], [place] [address] Phone: [phone]; Fax: [phone]

## 2014-08-17 ENCOUNTER — Other Ambulatory Visit: Payer: Self-pay | Admitting: Thoracic Surgery (Cardiothoracic Vascular Surgery)

## 2014-08-17 DIAGNOSIS — Z951 Presence of aortocoronary bypass graft: Secondary | ICD-10-CM

## 2014-08-22 ENCOUNTER — Ambulatory Visit (INDEPENDENT_AMBULATORY_CARE_PROVIDER_SITE_OTHER): Payer: Self-pay | Admitting: Thoracic Surgery (Cardiothoracic Vascular Surgery)

## 2014-08-22 ENCOUNTER — Encounter: Payer: Self-pay | Admitting: Thoracic Surgery (Cardiothoracic Vascular Surgery)

## 2014-08-22 ENCOUNTER — Ambulatory Visit
Admission: RE | Admit: 2014-08-22 | Discharge: 2014-08-22 | Disposition: A | Discharge status: Home / Self Care | Payer: 59 | Source: Ambulatory Visit | Attending: Thoracic Surgery (Cardiothoracic Vascular Surgery) | Admitting: Thoracic Surgery (Cardiothoracic Vascular Surgery)

## 2014-08-22 VITALS — BP 139/85 | HR 59 | Resp 16 | Ht 75.0 in | Wt 272.0 lb

## 2014-08-22 DIAGNOSIS — Z951 Presence of aortocoronary bypass graft: Secondary | ICD-10-CM

## 2014-08-22 DIAGNOSIS — I2510 Atherosclerotic heart disease of native coronary artery without angina pectoris: Secondary | ICD-10-CM

## 2014-08-22 NOTE — Progress Notes (Signed)
      [address]       [place] [id number]             [phone]       HPI: Benjamin King turns today for scheduled postoperative follow-up visit.  Benjamin King is a 54 year old man who presented in April with a non-ST elevation MI. Benjamin King had coronary bypass grafting 5 with bilateral mammaries 07/14/2014. Benjamin King postoperative course was, gated by atrial fibrillation. That was treated with amiodarone. Benjamin King was discharged home in sinus rhythm.  Overall Benjamin King feels well. Benjamin King does still have some occasional pain. Benjamin King is only taking Tylenol for that. Benjamin King's not had any anginal-type pain. Benjamin King is walking about 2-1/2 miles a day. Benjamin King is keeping a slow pace. Benjamin King is anxious to return to work and resume activities.  Benjamin King saw Dr. Diona BrownerMcDowell 3 weeks ago.  Past Medical History  Diagnosis Date  . Essential hypertension   . Hyperlipidemia   . CAD (coronary artery disease)     Multivessel status post CABG April 2016   . NSTEMI (non-ST elevated myocardial infarction)     April 2016       Current Outpatient Prescriptions  Medication Sig Dispense Refill  . amiodarone (PACERONE) 200 MG tablet Take 2 tablets (400 mg total) by mouth 2 (two) times daily. For 7 Days, then decrease to 200 mg BID for 7 days, then decrease to 200 mg daily 90 tablet 1  . aspirin 81 MG tablet Take 81 mg by mouth daily.    Marland Kitchen. lisinopril (PRINIVIL,ZESTRIL) 5 MG tablet Take 1 tablet (5 mg total) by mouth daily. 30 tablet 3  . metoprolol (LOPRESSOR) 50 MG tablet Take 1 tablet (50 mg total) by mouth 2 (two) times daily. 60 tablet 3  . Multiple Vitamins-Minerals (MULTIVITAMIN ADULT PO) Take 1 tablet by mouth daily.    . simvastatin (ZOCOR) 20 MG tablet Take 1 tablet (20 mg total) by mouth daily at 6 PM. 30 tablet 3   No current facility-administered medications for this visit.    Physical Exam BP 139/85 mmHg  Pulse 59  Resp 16  Ht 6\' 3"  (1.905 m)  Wt 272 lb (123.378 kg)  BMI 34.00 kg/m2  SpO692 6798% 54 year old male in no acute  distress Alert and oriented 3 with no focal deficits Cardiac regular rate and rhythm normal S1 and S2, no rubs or murmurs Lungs clear with equal breath sounds bilaterally Sternum stable, incision healing well Leg incision healing well  Diagnostic Tests: I personally reviewed Benjamin King chest x-ray shows postoperative changes, there is no effusion.  Impression: 54 year old man who is now about 6 weeks out from coronary bypass grafting 5. Benjamin King is doing very well at this point in time. Benjamin King's having minimal pain.  Benjamin King exercise tolerance is good. I think Benjamin King is ready to start cardiac rehabilitation. I advised him to call Dr. Ival BibleMcDowell's office for referral.  Benjamin King no longer is restricted to 10 pounds for lifting. I did advise him to gradually increase Benjamin King activities.  Benjamin King may begin driving locally. Benjamin King should not ride Benjamin King motorcycle until 3 months postop. Benjamin King likely will be able to return to work at around 3 months postop.  Plan: Follow-up with Dr. Diona BrownerMcDowell.  I will be happy to see him back any time if I can be of any further assistance with Benjamin King care.  Loreli SlotSteven C Jadrien Narine, MD [facility] [phone]

## 2014-08-24 ENCOUNTER — Telehealth: Payer: Self-pay | Admitting: Cardiology

## 2014-08-24 NOTE — Telephone Encounter (Signed)
Benjamin King called wanting to know if he is to continue taking 81mg  of ASA.

## 2014-08-24 NOTE — Telephone Encounter (Signed)
Patient advised that per office note, he is to stay on the same dose he was on when he came to his last office visit. Patient verbalized understanding of plan.

## 2014-08-29 ENCOUNTER — Encounter (HOSPITAL_COMMUNITY)
Admission: RE | Admit: 2014-08-29 | Discharge: 2014-08-29 | Disposition: A | Discharge status: Home / Self Care | Payer: Managed Care, Other (non HMO) | Source: Ambulatory Visit | Attending: Cardiology | Admitting: Cardiology

## 2014-08-29 VITALS — BP 142/78 | HR 62 | Ht 75.0 in | Wt 276.4 lb

## 2014-08-29 DIAGNOSIS — I252 Old myocardial infarction: Secondary | ICD-10-CM | POA: Diagnosis not present

## 2014-08-29 DIAGNOSIS — I214 Non-ST elevation (NSTEMI) myocardial infarction: Secondary | ICD-10-CM

## 2014-08-29 DIAGNOSIS — Z951 Presence of aortocoronary bypass graft: Secondary | ICD-10-CM | POA: Diagnosis not present

## 2014-08-29 NOTE — Progress Notes (Addendum)
Patient arrived 0800. Patient was referred by Dr. Diona BrownerMcDowell due to CABGx5 Z95.1/NSTEMI I21.4. During orientation advised patient on arrival and appointment times what to wear, what to do before, during and after exercise. Reviewed attendance and class policy. Talked about inclement weather and class consultation policy. Pt is scheduled to start Cardiac Rehab on 09/01/14 at 9:30 am. Pt was advised to come to class 5 minutes before class starts. He was also given instructions on meeting with the dietician and attending the Family Structure classes. Pt is eager to get started. Patient was able to finish the 6 minute walk test. Patient was orientated to gym and he was measured for the equipment. Patient finish orientation at 11:00am.

## 2014-08-29 NOTE — Progress Notes (Signed)
Cardiac/Pulmonary Rehab Medication Review by King Pharmacist  Does the patient  feel that his/her medications are working for him/her?  yes  Has the patient been experiencing any side effects to the medications prescribed?  yes  Does the patient measure his/her own blood pressure or blood glucose at home?  yes   Does the patient have any problems obtaining medications due to transportation or finances?   no  Understanding of regimen: excellent Understanding of indications: excellent Potential of compliance: excellent  Questions asked to Determine Patient Understanding of Medication Regimen:  1. What is the name of the medication?  2. What is the medication used for?  3. When should it be taken?  4. How much should be taken?  5. How will you take it?  6. What side effects should you report?  Understanding Defined as: Excellent: All questions above are correct Good: Questions 1-4 are correct Fair: Questions 1-2 are correct  Poor: 1 or none of the above questions are correct   Pharmacist comments: Pt states he does have problems falling asleep at night but not sure if medications are the reason.  No side effects reported.  Pt doing well with medications.  Benjamin King, Benjamin King 08/29/2014 8:42 AM

## 2014-09-01 ENCOUNTER — Encounter (HOSPITAL_COMMUNITY)
Admission: RE | Admit: 2014-09-01 | Discharge: 2014-09-01 | Disposition: A | Discharge status: Home / Self Care | Payer: Managed Care, Other (non HMO) | Source: Ambulatory Visit | Attending: Cardiology | Admitting: Cardiology

## 2014-09-01 DIAGNOSIS — I252 Old myocardial infarction: Secondary | ICD-10-CM | POA: Diagnosis not present

## 2014-09-04 ENCOUNTER — Encounter (HOSPITAL_COMMUNITY)
Admission: RE | Admit: 2014-09-04 | Discharge: 2014-09-04 | Disposition: A | Discharge status: Home / Self Care | Payer: Managed Care, Other (non HMO) | Source: Ambulatory Visit | Attending: Cardiology | Admitting: Cardiology

## 2014-09-04 DIAGNOSIS — I252 Old myocardial infarction: Secondary | ICD-10-CM | POA: Diagnosis not present

## 2014-09-06 ENCOUNTER — Encounter (HOSPITAL_COMMUNITY): Payer: Managed Care, Other (non HMO)

## 2014-09-08 ENCOUNTER — Encounter (HOSPITAL_COMMUNITY)
Admission: RE | Admit: 2014-09-08 | Discharge: 2014-09-08 | Disposition: A | Discharge status: Home / Self Care | Payer: Managed Care, Other (non HMO) | Source: Ambulatory Visit | Attending: Cardiology | Admitting: Cardiology

## 2014-09-08 DIAGNOSIS — I252 Old myocardial infarction: Secondary | ICD-10-CM | POA: Diagnosis not present

## 2014-09-11 ENCOUNTER — Encounter (HOSPITAL_COMMUNITY)
Admission: RE | Admit: 2014-09-11 | Discharge: 2014-09-11 | Disposition: A | Discharge status: Home / Self Care | Payer: Managed Care, Other (non HMO) | Source: Ambulatory Visit | Attending: Cardiology | Admitting: Cardiology

## 2014-09-11 DIAGNOSIS — I252 Old myocardial infarction: Secondary | ICD-10-CM | POA: Diagnosis not present

## 2014-09-13 ENCOUNTER — Encounter (HOSPITAL_COMMUNITY)
Admission: RE | Admit: 2014-09-13 | Discharge: 2014-09-13 | Disposition: A | Discharge status: Home / Self Care | Payer: Managed Care, Other (non HMO) | Source: Ambulatory Visit | Attending: Cardiology | Admitting: Cardiology

## 2014-09-13 ENCOUNTER — Telehealth: Payer: Self-pay | Admitting: Cardiology

## 2014-09-13 DIAGNOSIS — I252 Old myocardial infarction: Secondary | ICD-10-CM | POA: Diagnosis not present

## 2014-09-13 NOTE — Telephone Encounter (Signed)
Patient is wanting to know when he can mow his yard.

## 2014-09-14 NOTE — Telephone Encounter (Signed)
Patient wants to know when he can start cutting grass again. Nurse advised patient that MD was out of the office and it may be next week before we get a response.

## 2014-09-14 NOTE — Telephone Encounter (Signed)
May generally start back three months after surgery.

## 2014-09-15 ENCOUNTER — Encounter (HOSPITAL_COMMUNITY)
Admission: RE | Admit: 2014-09-15 | Discharge: 2014-09-15 | Disposition: A | Discharge status: Home / Self Care | Payer: Managed Care, Other (non HMO) | Source: Ambulatory Visit | Attending: Cardiology | Admitting: Cardiology

## 2014-09-15 DIAGNOSIS — I252 Old myocardial infarction: Secondary | ICD-10-CM | POA: Diagnosis not present

## 2014-09-15 NOTE — Progress Notes (Signed)
Patient was given Individual Home Exercise Plan. Handout was reviewed and discussed. Patient verbalized an understanding. 

## 2014-09-15 NOTE — Telephone Encounter (Signed)
Patient informed via vm. 

## 2014-09-15 NOTE — Progress Notes (Addendum)
Cardiac Rehabilitation Program Outcomes Report   Orientation:  08/29/14 Graduate Date:  tbd Discharge Date:  tbd # of sessions completed: 3  Cardiologist: Diona Browner Family MD:  No PCP Class Time:  0930  A.  Exercise Program:  Tolerates exercise @ 3.76 METS for 15 minutes and Walk Test Results:  Pre: 3.11 mets  B.  Mental Health:  Good mental attitude  C.  Education/Instruction/Skills  Accurately checks own pulse.  Rest:  68  Exercise:  85  Uses Perceived Exertion Scale and/or Dyspnea Scale  D.  Nutrition/Weight Control/Body Composition:  Adherence to prescribed nutrition program: fair    E.  Blood Lipids    Lab Results  Component Value Date   CHOL 117 07/10/2014   HDL 26* 07/10/2014   LDLCALC 53 07/10/2014   TRIG 190* 07/10/2014   CHOLHDL 4.5 07/10/2014    F.  Lifestyle Changes:  Making positive lifestyle changes and Not smoking:  Quit 07/05/14  G.  Symptoms noted with exercise:  Asymptomatic  Report Completed By:  Doretha Sou RN   Comments:  This is patients first week progress note in [facility]

## 2014-09-18 ENCOUNTER — Encounter (HOSPITAL_COMMUNITY)
Admission: RE | Admit: 2014-09-18 | Discharge: 2014-09-18 | Disposition: A | Discharge status: Home / Self Care | Payer: Managed Care, Other (non HMO) | Source: Ambulatory Visit | Attending: Cardiology | Admitting: Cardiology

## 2014-09-18 DIAGNOSIS — I252 Old myocardial infarction: Secondary | ICD-10-CM | POA: Diagnosis not present

## 2014-09-20 ENCOUNTER — Encounter (HOSPITAL_COMMUNITY)
Admission: RE | Admit: 2014-09-20 | Discharge: 2014-09-20 | Disposition: A | Discharge status: Home / Self Care | Payer: Managed Care, Other (non HMO) | Source: Ambulatory Visit | Attending: Cardiology | Admitting: Cardiology

## 2014-09-20 DIAGNOSIS — I252 Old myocardial infarction: Secondary | ICD-10-CM | POA: Diagnosis not present

## 2014-09-22 ENCOUNTER — Encounter (HOSPITAL_COMMUNITY)
Admission: RE | Admit: 2014-09-22 | Discharge: 2014-09-22 | Disposition: A | Discharge status: Home / Self Care | Payer: Managed Care, Other (non HMO) | Source: Ambulatory Visit | Attending: Cardiology | Admitting: Cardiology

## 2014-09-22 DIAGNOSIS — I252 Old myocardial infarction: Secondary | ICD-10-CM | POA: Diagnosis not present

## 2014-09-22 DIAGNOSIS — Z951 Presence of aortocoronary bypass graft: Secondary | ICD-10-CM | POA: Diagnosis not present

## 2014-09-25 ENCOUNTER — Encounter (HOSPITAL_COMMUNITY): Payer: Managed Care, Other (non HMO)

## 2014-09-27 ENCOUNTER — Encounter (HOSPITAL_COMMUNITY)
Admission: RE | Admit: 2014-09-27 | Discharge: 2014-09-27 | Disposition: A | Discharge status: Home / Self Care | Payer: Managed Care, Other (non HMO) | Source: Ambulatory Visit | Attending: Cardiology | Admitting: Cardiology

## 2014-09-27 DIAGNOSIS — I252 Old myocardial infarction: Secondary | ICD-10-CM | POA: Diagnosis not present

## 2014-09-29 ENCOUNTER — Encounter (HOSPITAL_COMMUNITY)
Admission: RE | Admit: 2014-09-29 | Discharge: 2014-09-29 | Disposition: A | Discharge status: Home / Self Care | Payer: Managed Care, Other (non HMO) | Source: Ambulatory Visit | Attending: Cardiology | Admitting: Cardiology

## 2014-09-29 DIAGNOSIS — I252 Old myocardial infarction: Secondary | ICD-10-CM | POA: Diagnosis not present

## 2014-10-02 ENCOUNTER — Encounter (HOSPITAL_COMMUNITY)
Admission: RE | Admit: 2014-10-02 | Discharge: 2014-10-02 | Disposition: A | Discharge status: Home / Self Care | Payer: Managed Care, Other (non HMO) | Source: Ambulatory Visit | Attending: Cardiology | Admitting: Cardiology

## 2014-10-02 DIAGNOSIS — I252 Old myocardial infarction: Secondary | ICD-10-CM | POA: Diagnosis not present

## 2014-10-03 ENCOUNTER — Encounter (HOSPITAL_COMMUNITY)
Admission: AD | Disposition: A | Discharge status: Home / Self Care | Payer: Self-pay | Source: Other Acute Inpatient Hospital | Attending: Cardiovascular Disease

## 2014-10-03 ENCOUNTER — Inpatient Hospital Stay (HOSPITAL_COMMUNITY)
Admission: AD | Admit: 2014-10-03 | Discharge: 2014-10-06 | DRG: 246 | Disposition: A | Discharge status: Home / Self Care | Payer: Managed Care, Other (non HMO) | Source: Other Acute Inpatient Hospital | Attending: Cardiovascular Disease | Admitting: Cardiovascular Disease

## 2014-10-03 DIAGNOSIS — I200 Unstable angina: Secondary | ICD-10-CM

## 2014-10-03 DIAGNOSIS — Z87891 Personal history of nicotine dependence: Secondary | ICD-10-CM

## 2014-10-03 DIAGNOSIS — Y838 Other surgical procedures as the cause of abnormal reaction of the patient, or of later complication, without mention of misadventure at the time of the procedure: Secondary | ICD-10-CM | POA: Diagnosis present

## 2014-10-03 DIAGNOSIS — Z951 Presence of aortocoronary bypass graft: Secondary | ICD-10-CM

## 2014-10-03 DIAGNOSIS — Z8249 Family history of ischemic heart disease and other diseases of the circulatory system: Secondary | ICD-10-CM

## 2014-10-03 DIAGNOSIS — Z7982 Long term (current) use of aspirin: Secondary | ICD-10-CM | POA: Diagnosis not present

## 2014-10-03 DIAGNOSIS — I25111 Atherosclerotic heart disease of native coronary artery with angina pectoris with documented spasm: Secondary | ICD-10-CM | POA: Diagnosis not present

## 2014-10-03 DIAGNOSIS — T82858A Stenosis of vascular prosthetic devices, implants and grafts, initial encounter: Secondary | ICD-10-CM | POA: Diagnosis present

## 2014-10-03 DIAGNOSIS — I214 Non-ST elevation (NSTEMI) myocardial infarction: Secondary | ICD-10-CM | POA: Diagnosis present

## 2014-10-03 DIAGNOSIS — Z79899 Other long term (current) drug therapy: Secondary | ICD-10-CM

## 2014-10-03 DIAGNOSIS — I25810 Atherosclerosis of coronary artery bypass graft(s) without angina pectoris: Secondary | ICD-10-CM | POA: Diagnosis present

## 2014-10-03 DIAGNOSIS — I252 Old myocardial infarction: Secondary | ICD-10-CM | POA: Diagnosis not present

## 2014-10-03 DIAGNOSIS — R7303 Prediabetes: Secondary | ICD-10-CM | POA: Diagnosis present

## 2014-10-03 DIAGNOSIS — R7309 Other abnormal glucose: Secondary | ICD-10-CM | POA: Diagnosis not present

## 2014-10-03 DIAGNOSIS — I25110 Atherosclerotic heart disease of native coronary artery with unstable angina pectoris: Secondary | ICD-10-CM | POA: Diagnosis present

## 2014-10-03 DIAGNOSIS — I25700 Atherosclerosis of coronary artery bypass graft(s), unspecified, with unstable angina pectoris: Secondary | ICD-10-CM | POA: Insufficient documentation

## 2014-10-03 DIAGNOSIS — I25710 Atherosclerosis of autologous vein coronary artery bypass graft(s) with unstable angina pectoris: Secondary | ICD-10-CM | POA: Diagnosis not present

## 2014-10-03 DIAGNOSIS — E785 Hyperlipidemia, unspecified: Secondary | ICD-10-CM | POA: Diagnosis present

## 2014-10-03 DIAGNOSIS — I10 Essential (primary) hypertension: Secondary | ICD-10-CM | POA: Diagnosis present

## 2014-10-03 DIAGNOSIS — I249 Acute ischemic heart disease, unspecified: Secondary | ICD-10-CM | POA: Diagnosis present

## 2014-10-03 DIAGNOSIS — I4891 Unspecified atrial fibrillation: Secondary | ICD-10-CM | POA: Diagnosis present

## 2014-10-03 HISTORY — PX: CARDIAC CATHETERIZATION: SHX172

## 2014-10-03 LAB — BASIC METABOLIC PANEL
Anion gap: 7 (ref 5–15)
BUN: 7 mg/dL (ref 6–20)
CO2: 25 mmol/L (ref 22–32)
Calcium: 8.7 mg/dL — ABNORMAL LOW (ref 8.9–10.3)
Chloride: 105 mmol/L (ref 101–111)
Creatinine, Ser: 0.69 mg/dL (ref 0.61–1.24)
GFR calc Af Amer: >60 mL/min (ref >60)
Glucose, Bld: 116 mg/dL — ABNORMAL HIGH (ref 65–99)
Potassium: 3.7 mmol/L (ref 3.5–5.1)
SODIUM: 137 mmol/L (ref 135–145)

## 2014-10-03 LAB — RAPID URINE DRUG SCREEN, HOSP PERFORMED
Amphetamines: NOT DETECTED
Barbiturates: NOT DETECTED
Benzodiazepines: NOT DETECTED
Cocaine: NOT DETECTED
OPIATES: POSITIVE — AB
TETRAHYDROCANNABINOL: NOT DETECTED

## 2014-10-03 LAB — POCT ACTIVATED CLOTTING TIME: ACTIVATED CLOTTING TIME: 460 s

## 2014-10-03 LAB — TROPONIN I: Troponin I: 3.4 ng/mL (ref <0.031)

## 2014-10-03 LAB — MRSA PCR SCREENING: MRSA by PCR: NEGATIVE

## 2014-10-03 SURGERY — LEFT HEART CATH AND CORONARY ANGIOGRAPHY
Anesthesia: LOCAL

## 2014-10-03 MED ORDER — FENTANYL CITRATE (PF) 100 MCG/2ML IJ SOLN
INTRAMUSCULAR | Status: AC
  Filled 2014-10-03: qty 2

## 2014-10-03 MED ORDER — TICAGRELOR 90 MG PO TABS
ORAL_TABLET | ORAL | Status: AC
  Filled 2014-10-03: qty 2

## 2014-10-03 MED ORDER — ASPIRIN 81 MG PO CHEW: 324.0000 mg | CHEWABLE_TABLET | ORAL | Status: AC

## 2014-10-03 MED ORDER — NITROGLYCERIN 1 MG/10 ML FOR IR/CATH LAB
INTRA_ARTERIAL | Status: DC | PRN
  Administered 2014-10-03: 19:00:00

## 2014-10-03 MED ORDER — ASPIRIN 300 MG RE SUPP: 300.0000 mg | RECTAL | Status: AC

## 2014-10-03 MED ORDER — NITROGLYCERIN 0.4 MG SL SUBL: 0.4000 mg | SUBLINGUAL_TABLET | SUBLINGUAL | Status: DC | PRN

## 2014-10-03 MED ORDER — HEPARIN (PORCINE) IN NACL 100-0.45 UNIT/ML-% IJ SOLN: 1500.0000 [IU]/h | INTRAMUSCULAR | Status: DC

## 2014-10-03 MED ORDER — MIDAZOLAM HCL 2 MG/2ML IJ SOLN
INTRAMUSCULAR | Status: AC
  Filled 2014-10-03: qty 2

## 2014-10-03 MED ORDER — IOHEXOL 300 MG/ML  SOLN
INTRAMUSCULAR | Status: DC | PRN
  Administered 2014-10-03: 340 mL via INTRA_ARTERIAL

## 2014-10-03 MED ORDER — ACETAMINOPHEN 325 MG PO TABS
650.0000 mg | ORAL_TABLET | ORAL | Status: DC | PRN
  Administered 2014-10-04: 650 mg via ORAL

## 2014-10-03 MED ORDER — HYDROMORPHONE HCL 1 MG/ML IJ SOLN
1.0000 mg | Freq: Once | INTRAMUSCULAR | Status: AC
  Administered 2014-10-03: 1 mg via INTRAVENOUS
  Filled 2014-10-03: qty 1

## 2014-10-03 MED ORDER — SODIUM CHLORIDE 0.9 % IV SOLN: 250.0000 mL | INTRAVENOUS | Status: DC | PRN

## 2014-10-03 MED ORDER — BIVALIRUDIN 250 MG IV SOLR
INTRAVENOUS | Status: AC
  Filled 2014-10-03: qty 250

## 2014-10-03 MED ORDER — TICAGRELOR 90 MG PO TABS
ORAL_TABLET | ORAL | Status: DC | PRN
  Administered 2014-10-03: 180 mg via ORAL

## 2014-10-03 MED ORDER — HYDROMORPHONE HCL 1 MG/ML IJ SOLN
0.5000 mg | INTRAMUSCULAR | Status: DC | PRN
  Administered 2014-10-03 – 2014-10-04 (×4): 0.5 mg via INTRAVENOUS
  Filled 2014-10-03 (×5): qty 1

## 2014-10-03 MED ORDER — ATORVASTATIN CALCIUM 40 MG PO TABS
40.0000 mg | ORAL_TABLET | Freq: Every day | ORAL | Status: DC
  Filled 2014-10-03: qty 1

## 2014-10-03 MED ORDER — ATORVASTATIN CALCIUM 80 MG PO TABS
80.0000 mg | ORAL_TABLET | Freq: Every day | ORAL | Status: DC
  Filled 2014-10-03: qty 1

## 2014-10-03 MED ORDER — FENTANYL CITRATE (PF) 100 MCG/2ML IJ SOLN
INTRAMUSCULAR | Status: DC | PRN
  Administered 2014-10-03: 50 ug via INTRAVENOUS
  Administered 2014-10-03 (×2): 25 ug via INTRAVENOUS

## 2014-10-03 MED ORDER — ASPIRIN EC 81 MG PO TBEC
81.0000 mg | DELAYED_RELEASE_TABLET | Freq: Every day | ORAL | Status: DC
  Administered 2014-10-04: 81 mg via ORAL
  Filled 2014-10-03 (×3): qty 1

## 2014-10-03 MED ORDER — ASPIRIN EC 81 MG PO TBEC: 81.0000 mg | DELAYED_RELEASE_TABLET | Freq: Every day | ORAL | Status: DC

## 2014-10-03 MED ORDER — NITROGLYCERIN IN D5W 200-5 MCG/ML-% IV SOLN
0.0000 ug/min | INTRAVENOUS | Status: DC
  Administered 2014-10-03: 70 ug/min via INTRAVENOUS
  Administered 2014-10-03: 30 ug/min via INTRAVENOUS
  Administered 2014-10-04: 100 ug/min via INTRAVENOUS
  Filled 2014-10-03: qty 250

## 2014-10-03 MED ORDER — HEPARIN (PORCINE) IN NACL 2-0.9 UNIT/ML-% IJ SOLN
INTRAMUSCULAR | Status: AC
  Filled 2014-10-03: qty 1500

## 2014-10-03 MED ORDER — SODIUM CHLORIDE 0.9 % IV SOLN
INTRAVENOUS | Status: DC
  Administered 2014-10-03 – 2014-10-04 (×2): via INTRAVENOUS

## 2014-10-03 MED ORDER — ACETAMINOPHEN 325 MG PO TABS
650.0000 mg | ORAL_TABLET | ORAL | Status: DC | PRN
  Filled 2014-10-03: qty 2

## 2014-10-03 MED ORDER — MIDAZOLAM HCL 2 MG/2ML IJ SOLN
INTRAMUSCULAR | Status: DC | PRN
  Administered 2014-10-03: 2 mg via INTRAVENOUS
  Administered 2014-10-03 (×2): 1 mg via INTRAVENOUS

## 2014-10-03 MED ORDER — SODIUM CHLORIDE 0.9 % IJ SOLN
3.0000 mL | Freq: Two times a day (BID) | INTRAMUSCULAR | Status: DC
Start: 2014-10-03 — End: 2014-10-04
  Administered 2014-10-03: 3 mL via INTRAVENOUS

## 2014-10-03 MED ORDER — LIDOCAINE HCL (PF) 1 % IJ SOLN
INTRAMUSCULAR | Status: DC | PRN
  Administered 2014-10-03: 17:00:00

## 2014-10-03 MED ORDER — ATORVASTATIN CALCIUM 80 MG PO TABS
80.0000 mg | ORAL_TABLET | Freq: Every day | ORAL | Status: DC
  Administered 2014-10-04 – 2014-10-05 (×2): 80 mg via ORAL
  Filled 2014-10-03 (×4): qty 1

## 2014-10-03 MED ORDER — SODIUM CHLORIDE 0.9 % IV SOLN
250.0000 mg | INTRAVENOUS | Status: DC | PRN
  Administered 2014-10-03: 1.75 mg/kg/h via INTRAVENOUS

## 2014-10-03 MED ORDER — ONDANSETRON HCL 4 MG/2ML IJ SOLN
4.0000 mg | Freq: Four times a day (QID) | INTRAMUSCULAR | Status: DC | PRN
  Administered 2014-10-04: 4 mg via INTRAVENOUS
  Filled 2014-10-03: qty 2

## 2014-10-03 MED ORDER — NITROGLYCERIN IN D5W 200-5 MCG/ML-% IV SOLN: 0.0000 ug/min | INTRAVENOUS | Status: DC

## 2014-10-03 MED ORDER — TICAGRELOR 90 MG PO TABS
90.0000 mg | ORAL_TABLET | Freq: Two times a day (BID) | ORAL | Status: DC
  Administered 2014-10-03 – 2014-10-05 (×4): 90 mg via ORAL
  Filled 2014-10-03 (×6): qty 1

## 2014-10-03 MED ORDER — HEPARIN BOLUS VIA INFUSION
4000.0000 [IU] | Freq: Once | INTRAVENOUS | Status: DC
  Filled 2014-10-03: qty 4000

## 2014-10-03 MED ORDER — HYDRALAZINE HCL 20 MG/ML IJ SOLN
10.0000 mg | INTRAMUSCULAR | Status: DC | PRN
  Administered 2014-10-03 – 2014-10-04 (×2): 10 mg via INTRAVENOUS
  Filled 2014-10-03 (×2): qty 1

## 2014-10-03 MED ORDER — METOPROLOL TARTRATE 25 MG PO TABS
50.0000 mg | ORAL_TABLET | Freq: Two times a day (BID) | ORAL | Status: DC
  Administered 2014-10-03 – 2014-10-06 (×6): 50 mg via ORAL
  Filled 2014-10-03 (×2): qty 2
  Filled 2014-10-03 (×5): qty 1

## 2014-10-03 MED ORDER — LIDOCAINE HCL (PF) 1 % IJ SOLN
INTRAMUSCULAR | Status: AC
  Filled 2014-10-03: qty 30

## 2014-10-03 MED ORDER — NITROGLYCERIN IN D5W 200-5 MCG/ML-% IV SOLN
INTRAVENOUS | Status: AC
  Filled 2014-10-03: qty 250

## 2014-10-03 MED ORDER — BIVALIRUDIN BOLUS VIA INFUSION - CUPID
INTRAVENOUS | Status: DC | PRN
  Administered 2014-10-03: 93.525 mg via INTRAVENOUS

## 2014-10-03 MED ORDER — SODIUM CHLORIDE 0.9 % IJ SOLN: 3.0000 mL | INTRAMUSCULAR | Status: DC | PRN

## 2014-10-03 MED ORDER — ONDANSETRON HCL 4 MG/2ML IJ SOLN: 4.0000 mg | Freq: Four times a day (QID) | INTRAMUSCULAR | Status: DC | PRN

## 2014-10-03 MED ORDER — SODIUM CHLORIDE 0.9 % IV SOLN
0.2500 mg/kg/h | INTRAVENOUS | Status: AC
  Administered 2014-10-03: 0.25 mg/kg/h via INTRAVENOUS
  Filled 2014-10-03 (×2): qty 250

## 2014-10-03 SURGICAL SUPPLY — 25 items
BALLN EMERGE MR 2.0X15 (BALLOONS) ×4
BALLN EMERGE MR 2.5X30 (BALLOONS) ×4
BALLN ~~LOC~~ EUPHORA RX 3.25X20 (BALLOONS) ×4
BALLOON EMERGE MR 2.0X15 (BALLOONS) ×2 IMPLANT
BALLOON EMERGE MR 2.5X30 (BALLOONS) ×2 IMPLANT
BALLOON ~~LOC~~ EUPHORA RX 3.25X20 (BALLOONS) ×2 IMPLANT
CATH INFINITI 5 FR IM (CATHETERS) ×4 IMPLANT
CATH INFINITI 5 FR RCB (CATHETERS) ×4 IMPLANT
CATH INFINITI 5FR JL5 (CATHETERS) ×4 IMPLANT
CATH INFINITI 5FR MULTPACK ANG (CATHETERS) ×4 IMPLANT
CATH INFINITI JR4 5F (CATHETERS) ×4 IMPLANT
CATH VISTA GUIDE 6FR LCB (CATHETERS) ×4 IMPLANT
KIT ENCORE 26 ADVANTAGE (KITS) ×4 IMPLANT
KIT HEART LEFT (KITS) ×4 IMPLANT
PACK CARDIAC CATHETERIZATION (CUSTOM PROCEDURE TRAY) ×4 IMPLANT
SHEATH PINNACLE 5F 10CM (SHEATH) ×4 IMPLANT
SHEATH PINNACLE 6F 10CM (SHEATH) ×4 IMPLANT
STENT SYNERGY DES 3X38 (Permanent Stent) ×4 IMPLANT
SYR MEDRAD MARK V 150ML (SYRINGE) ×4 IMPLANT
TRANSDUCER W/STOPCOCK (MISCELLANEOUS) ×4 IMPLANT
TUBING CIL FLEX 10 FLL-RA (TUBING) ×4 IMPLANT
WIRE COUGAR XT STRL 190CM (WIRE) ×4 IMPLANT
WIRE EMERALD 3MM-J .035X150CM (WIRE) ×8 IMPLANT
WIRE EMERALD 3MM-J .035X260CM (WIRE) ×4 IMPLANT
WIRE HI TORQ VERSACORE-J 145CM (WIRE) ×4 IMPLANT

## 2014-10-03 NOTE — Progress Notes (Signed)
Utilization Review Completed.Kody Brandl T7/02/2015  

## 2014-10-03 NOTE — Interval H&P Note (Signed)
Cath Lab Visit (complete for each Cath Lab visit)  Clinical Evaluation Leading to the Procedure:   ACS: No.  Non-ACS:    Anginal Classification: CCS IV  Anti-ischemic medical therapy: Maximal Therapy (2 or more classes of medications)  Non-Invasive Test Results: No non-invasive testing performed  Prior CABG: Previous CABG      History and Physical Interval Note:  10/03/2014 4:50 PM  Benjamin King  has presented today for surgery, with the diagnosis of cp  The various methods of treatment have been discussed with the patient and family. After consideration of risks, benefits and other options for treatment, the patient has consented to  Procedure(s): Left Heart Cath and Coronary Angiography (N/A) as a surgical intervention .  The patient's history has been reviewed, patient examined, no change in status, stable for surgery.  I have reviewed the patient's chart and labs.  Questions were answered to the patient's satisfaction.     Brithney Bensen A

## 2014-10-03 NOTE — Progress Notes (Signed)
ANTICOAGULATION CONSULT NOTE - Initial Consult  Pharmacy Consult for heparin Indication: chest pain/ACS/UA  No Known Allergies  Patient Measurements: Height: 6\' 3"  (190.5 cm) Weight: 274 lb 14.6 oz (124.7 kg) IBW/kg (Calculated) : 84.5 Heparin Dosing Weight: 111.3 kg  Vital Signs: BP: 187/113 mmHg (07/12 1555) Pulse Rate: 53 (07/12 1555)  Labs: No results for input(s): HGB, HCT, PLT, APTT, LABPROT, INR, HEPARINUNFRC, CREATININE, CKTOTAL, CKMB, TROPONINI in the last 72 hours.  CrCl cannot be calculated (Patient has no serum creatinine result on file.).   Medical History: Past Medical History  Diagnosis Date  . Essential hypertension   . Hyperlipidemia   . CAD (coronary artery disease)     Multivessel status post CABG April 2016   . NSTEMI (non-ST elevated myocardial infarction)     April 2016     Assessment: [age] yom with hx CAD s/p CABG 3 months ago admitted with chest discomfort, SOB, UA. Started on ntg drip and [facility]. Pharmacy consulted to dose heparin for ACS/CP (no anticoagulation pta). Patient to go for cath later today. No labs available. No bleeding documented.    Goal of Therapy:  Heparin level 0.3-0.7 units/ml Monitor platelets by anticoagulation protocol: Yes   Plan:  Heparin 4000 unit bolus  Heparin IV @ 1500 units/h  6h HL  Daily HL/CBC  F/u heparin plans post-cath   Babs BertinHaley Noha Karasik, PharmD Clinical Pharmacist - Resident Pager [phone] 10/03/2014 4:17 PM

## 2014-10-03 NOTE — H&P (Signed)
ADMISSION HISTORY AND PHYSICAL   Date: 10/03/2014               Patient Name:  Benjamin King MRN: [id number]  DOB: 01/03/1961 Age / Sex: 54 y.o., male        PCP: No PCP Per Patient Primary Cardiologist: Diona Browner          History of Present Illness: Patient is a 54 y.o. male with a PMHx of CAD , who was admitted to [facility] on 10/03/2014 for evaluation of recurrent symptoms of angina.Benjamin King has a history of coronary artery disease with severe three-vessel CAD in April, 2016. He had coronary artery bypass grafting including an LIMA to LAD, free RIMA to the first diagonal, sequential SVG to the PDA and posterior lateral branch, SVG to the OM 2. His postoperative course was complicated by a brief episode of rapid atrial fibrillation which was managed with beta blocker and short-term amiodarone.  He was last seen by Dr. Diona Browner in the office on May 11. At that time he was doing well.  Is one year work with some chest discomfort. It was substernal. It was associated with severe shortness of breath and it felt exactly like his previous episodes of pain. It woke him up this morning and has been fairly constant since that time ( about 7-8 hours )   He was seen in the [facility] emergency room. He's been treated with nitroglycerin but the pain has continued. He is started on nitroglycerin drip and [facility].  When he arrived he complained of persistent chest pain in the same shortness breath and has been having for many hours. We have elected to take him urgently to the Cath Lab.   Medications: Outpatient medications: Prescriptions prior to admission  Medication Sig Dispense Refill Last Dose  . amiodarone (PACERONE) 200 MG tablet Take 2 tablets (400 mg total) by mouth 2 (two) times daily. For 7 Days, then decrease to 200 mg BID for 7 days, then decrease to 200 mg daily 90 tablet 1 Taking  . aspirin 81 MG tablet Take 81 mg  by mouth daily.   Taking  . lisinopril (PRINIVIL,ZESTRIL) 5 MG tablet Take 1 tablet (5 mg total) by mouth daily. 30 tablet 3 Taking  . metoprolol (LOPRESSOR) 50 MG tablet Take 1 tablet (50 mg total) by mouth 2 (two) times daily. 60 tablet 3 Taking  . Multiple Vitamins-Minerals (MULTIVITAMIN ADULT PO) Take 1 tablet by mouth daily.   Taking  . simvastatin (ZOCOR) 20 MG tablet Take 1 tablet (20 mg total) by mouth daily at 6 PM. 30 tablet 3 Taking    No Known Allergies   Past Medical History  Diagnosis Date  . Essential hypertension   . Hyperlipidemia   . CAD (coronary artery disease)     Multivessel status post CABG April 2016   . NSTEMI (non-ST elevated myocardial infarction)     April 2016     Past Surgical History  Procedure Laterality Date  . Right knee surgery      Trauma  . Left heart catheterization with coronary angiogram N/A 07/10/2014    Procedure: LEFT HEART CATHETERIZATION WITH CORONARY ANGIOGRAM;  Surgeon: Corky Crafts, MD;  Location: [facility] CATH LAB;  Service: Cardiovascular;  Laterality: N/A;  . Coronary artery bypass graft N/A 07/14/2014    Procedure: CORONARY ARTERY BYPASS GRAFTING (CABG), ON PUMP, TIMES FIVE, USING BILATERAL MAMMARY  ARTERIES, RIGHT GREATER SAPHENOUS VEIN HARVESTED ENDOSCOPICALLY;  Surgeon: Loreli Slot, MD;  Location: [facility] OR;  Service: Open Heart Surgery;  Laterality: N/A;  Bilateral Mammary Arteries  . Tee without cardioversion N/A 07/14/2014    Procedure: TRANSESOPHAGEAL ECHOCARDIOGRAM (TEE);  Surgeon: Loreli Slot, MD;  Location: [facility] OR;  Service: Open Heart Surgery;  Laterality: N/A;    Family History  Problem Relation Age of Onset  . Coronary artery disease Mother     Premature disease status post CABG  . Clotting disorder Father     Social History:  reports that he has quit smoking. His smoking use included Cigars. He has never used smokeless tobacco. He reports that he does not drink alcohol or use illicit drugs.   Review  of Systems: Constitutional:  denies fever, chills, diaphoresis, appetite change and fatigue.  HEENT: denies photophobia, eye pain, redness, hearing loss, ear pain, congestion, sore throat, rhinorrhea, sneezing, neck pain, neck stiffness and tinnitus.  Respiratory: admits to SOB   Cardiovascular: admits to chest pain, denies  palpitations and leg swelling.  Gastrointestinal: denies nausea, vomiting, abdominal pain, diarrhea, constipation, blood in stool.  Genitourinary: denies dysuria, urgency, frequency, hematuria, flank pain and difficulty urinating.  Musculoskeletal: denies  myalgias, back pain, joint swelling, arthralgias and gait problem.   Skin: denies pallor, rash and wound.  Neurological: denies dizziness, seizures, syncope, weakness, light-headedness, numbness and headaches.   Hematological: denies adenopathy, easy bruising, personal or family bleeding history.  Psychiatric/ Behavioral: denies suicidal ideation, mood changes, confusion, nervousness, sleep disturbance and agitation.    Physical Exam: BP 187/113 mmHg  Pulse 53  Resp 17  Ht 6\' 3"  (1.905 m)  Wt 124.7 kg (274 lb 14.6 oz)  BMI 34.36 kg/m2  SpO2 100%  Wt Readings from Last 3 Encounters:  10/03/14 124.7 kg (274 lb 14.6 oz)  08/29/14 125.374 kg (276 lb 6.4 oz)  08/22/14 123.378 kg (272 lb)    General: Vital signs reviewed and noted. Well-developed, well-nourished, in no acute distress; alert,   Head: Normocephalic, atraumatic, sclera anicteric, mucus membranes are moist   Neck: Supple. Negative for carotid bruits. JVD not elevated.   Lungs:  Clear bilaterally to auscultation without wheezes, rales, or rhonchi. Breathing is normal   Heart: RRR with S1 S2. No murmurs, rubs, or gallops.   Abdomen:  Soft, non-tender, non-distended with normoactive bowel sounds. No hepatomegaly. No rebound/guarding. No obvious abdominal masses   MSK: Strength and the appear normal for age.   Extremities: No clubbing or cyanosis. No  edema.  Distal pedal pulses are 2+ and equal bilaterally .  Neurologic: Alert and oriented X 3. Moves all extremities spontaneously   Psych:  normal     Lab results: Basic Metabolic Panel: No results for input(s): NA, K, CL, CO2, GLUCOSE, BUN, CREATININE, CALCIUM, MG, PHOS in the last 168 hours.  Liver Function Tests: No results for input(s): AST, ALT, ALKPHOS, BILITOT, PROT, ALBUMIN in the last 168 hours. No results for input(s): LIPASE, AMYLASE in the last 168 hours.  CBC: No results for input(s): WBC, NEUTROABS, HGB, HCT, MCV, PLT in the last 168 hours.  Cardiac Enzymes: No results for input(s): CKTOTAL, CKMB, CKMBINDEX, TROPONINI in the last 168 hours.  BNP: Invalid input(s): POCBNP  CBG: No results for input(s): GLUCAP in the last 168 hours.  Coagulation Studies: No results for input(s): LABPROT, INR in the last 72 hours.   Other results: EKG ( reviewed by me )  NST TWI in the anterior and  lateral leads   Imaging: No results found.    Assessment & Plan:  1. Unstable angina/acute cornea syndrome: The patient presents with a history of coronary artery disease and coronary artery bypass grafting approximately 3 months ago. He now presents with very similar episodes of angina. He describes the pain as a midsternal heaviness with associated shortness of breath. The shortness of breath has continued throughout the day.  His initial troponin levels were negative but given his persistent symptoms I think that it would be prudent to proceed with cardiac catheterization today. We discussed the risks, benefits, and options of cardiac Rotation. He understands and agrees to proceed.  2. Postoperative atrial fibrillation: The patient has stayed in normal sinus rhythm.  He's been on amiodarone for the past 3 months. I agree with Dr. Diona BrownerMcDowell that I think we can stop it at this time.  3. Essential hypertension:  His blood pressure on arrival was fairly high. We will adjust his  medications as needed.  4. Hyperlipidemia: He's currently on Simvastatin 20 . I would like to change and to atorvastatin 40.   Vesta MixerPhilip J. Trentan Trippe, Montez HagemanJr., MD, [facility] Center[facility]ACC 10/03/2014, 3:56 PM

## 2014-10-04 ENCOUNTER — Encounter (HOSPITAL_COMMUNITY): Payer: Self-pay | Admitting: Cardiovascular Disease

## 2014-10-04 ENCOUNTER — Ambulatory Visit: Payer: 59 | Admitting: Cardiology

## 2014-10-04 ENCOUNTER — Encounter (HOSPITAL_COMMUNITY): Payer: Managed Care, Other (non HMO)

## 2014-10-04 DIAGNOSIS — R7303 Prediabetes: Secondary | ICD-10-CM | POA: Diagnosis present

## 2014-10-04 DIAGNOSIS — I25110 Atherosclerotic heart disease of native coronary artery with unstable angina pectoris: Secondary | ICD-10-CM

## 2014-10-04 DIAGNOSIS — I214 Non-ST elevation (NSTEMI) myocardial infarction: Secondary | ICD-10-CM

## 2014-10-04 LAB — BASIC METABOLIC PANEL
ANION GAP: 10 (ref 5–15)
BUN: 8 mg/dL (ref 6–20)
CHLORIDE: 105 mmol/L (ref 101–111)
CO2: 23 mmol/L (ref 22–32)
Calcium: 8.7 mg/dL — ABNORMAL LOW (ref 8.9–10.3)
Creatinine, Ser: 0.75 mg/dL (ref 0.61–1.24)
GFR calc non Af Amer: >60 mL/min (ref >60)
Glucose, Bld: 111 mg/dL — ABNORMAL HIGH (ref 65–99)
Potassium: 3.7 mmol/L (ref 3.5–5.1)
SODIUM: 138 mmol/L (ref 135–145)

## 2014-10-04 LAB — LIPID PANEL
Cholesterol: 138 mg/dL (ref 0–200)
HDL: 33 mg/dL — AB (ref >40)
LDL Cholesterol: 90 mg/dL (ref 0–99)
Total CHOL/HDL Ratio: 4.2 RATIO
Triglycerides: 73 mg/dL (ref <150)
VLDL: 15 mg/dL (ref 0–40)

## 2014-10-04 LAB — CBC
HEMATOCRIT: 34.7 % — AB (ref 39.0–52.0)
HEMOGLOBIN: 11.6 g/dL — AB (ref 13.0–17.0)
MCH: 29 pg (ref 26.0–34.0)
MCHC: 33.4 g/dL (ref 30.0–36.0)
MCV: 86.8 fL (ref 78.0–100.0)
PLATELETS: 255 10*3/uL (ref 150–400)
RBC: 4 MIL/uL — ABNORMAL LOW (ref 4.22–5.81)
RDW: 13.6 % (ref 11.5–15.5)
WBC: 13.9 10*3/uL — ABNORMAL HIGH (ref 4.0–10.5)

## 2014-10-04 LAB — TROPONIN I
TROPONIN I: 6.37 ng/mL — AB (ref <0.031)
Troponin I: 3.54 ng/mL (ref <0.031)

## 2014-10-04 MED ORDER — SODIUM CHLORIDE 0.9 % WEIGHT BASED INFUSION
1.0000 mL/kg/h | INTRAVENOUS | Status: DC
Start: 2014-10-04 — End: 2014-10-05
  Administered 2014-10-04 – 2014-10-05 (×2): 1 mL/kg/h via INTRAVENOUS

## 2014-10-04 MED ORDER — SODIUM CHLORIDE 0.9 % IJ SOLN: 3.0000 mL | INTRAMUSCULAR | Status: DC | PRN

## 2014-10-04 MED ORDER — LISINOPRIL 5 MG PO TABS
5.0000 mg | ORAL_TABLET | Freq: Every day | ORAL | Status: DC
  Administered 2014-10-04 – 2014-10-06 (×3): 5 mg via ORAL
  Filled 2014-10-04 (×3): qty 1

## 2014-10-04 MED ORDER — ASPIRIN 81 MG PO CHEW
81.0000 mg | CHEWABLE_TABLET | ORAL | Status: AC
  Administered 2014-10-05: 81 mg via ORAL
  Filled 2014-10-04: qty 1

## 2014-10-04 MED ORDER — ATROPINE SULFATE 0.1 MG/ML IJ SOLN
INTRAMUSCULAR | Status: AC
  Filled 2014-10-04: qty 10

## 2014-10-04 MED ORDER — ALUM & MAG HYDROXIDE-SIMETH 200-200-20 MG/5ML PO SUSP
30.0000 mL | ORAL | Status: DC | PRN
  Administered 2014-10-04: 30 mL via ORAL
  Filled 2014-10-04: qty 30

## 2014-10-04 MED ORDER — HYDROMORPHONE HCL 1 MG/ML IJ SOLN
1.0000 mg | Freq: Once | INTRAMUSCULAR | Status: AC
  Administered 2014-10-04: 1 mg via INTRAVENOUS
  Filled 2014-10-04: qty 1

## 2014-10-04 MED ORDER — SODIUM CHLORIDE 0.9 % IJ SOLN
3.0000 mL | Freq: Two times a day (BID) | INTRAMUSCULAR | Status: DC
  Administered 2014-10-04 – 2014-10-05 (×2): 3 mL via INTRAVENOUS

## 2014-10-04 MED ORDER — SODIUM CHLORIDE 0.9 % IV SOLN: 250.0000 mL | INTRAVENOUS | Status: DC | PRN

## 2014-10-04 MED FILL — Nitroglycerin IV Soln 200 MCG/ML in D5W: INTRAVENOUS | Qty: 250 | Status: AC

## 2014-10-04 NOTE — Progress Notes (Signed)
Subjective:    Day of hospitalization: 1  VSS.  No overnight events.  Pt is feeling well this AM.  Denies CP or SOB.  Had some nausea relieved with zofran.      Objective:   Temp:  [97.3 F (36.3 C)-99.5 F (37.5 C)] 99.5 F (37.5 C) (07/13 0807) Pulse Rate:  [0-81] 73 (07/13 0915) Resp:  [0-79] 19 (07/13 0900) BP: (126-216)/(70-121) 164/92 mmHg (07/13 0915) SpO2:  [97 %-100 %] 100 % (07/13 0900) Arterial Line BP: (151-185)/(82-116) 164/85 mmHg (07/13 0135) Weight:  [124.7 kg (274 lb 14.6 oz)-128.05 kg (282 lb 4.8 oz)] 128.05 kg (282 lb 4.8 oz) (07/13 0330)    Filed Weights   10/03/14 1513 10/04/14 0330  Weight: 124.7 kg (274 lb 14.6 oz) 128.05 kg (282 lb 4.8 oz)    Intake/Output Summary (Last 24 hours) at 10/04/14 0917 Last data filed at 10/04/14 0800  Gross per 24 hour  Intake 2168.3 ml  Output   1400 ml  Net  768.3 ml    Telemetry: NSR  Physical Exam: General: NAD. HEENT: Conjunctiva normal, oropharynx clear. Lungs: CTAB, nonlabored. Cardiac: RRR, no m/r/g. Abdomen: +BS, NT/ND.   Extremities: No LE edema. Femoral cath site without hematoma. Neuro: Alert and oriented x3. Moving all extremities.   Lab Results:  Basic Metabolic Panel:  Recent Labs Lab 10/03/14 2157 10/04/14 0408  NA 137 138  K 3.7 3.7  CL 105 105  CO2 25 23  GLUCOSE 116* 111*  BUN 7 8  CREATININE 0.69 0.75  CALCIUM 8.7* 8.7*    Liver Function Tests: No results for input(s): AST, ALT, ALKPHOS, BILITOT, PROT, ALBUMIN in the last 168 hours.  CBC:  Recent Labs Lab 10/04/14 0408  WBC 13.9*  HGB 11.6*  HCT 34.7*  MCV 86.8  PLT 255    Cardiac Enzymes:  Recent Labs Lab 10/03/14 2157 10/04/14 0408  TROPONINI 3.40* 6.37*    BNP: No results for input(s): PROBNP in the last 8760 hours.  Coagulation: No results for input(s): INR in the last 168 hours.  Radiology: No results found.   ECG:   Medications:   Scheduled Medications: . aspirin  324 mg Oral NOW     Or  . aspirin  300 mg Rectal NOW  . aspirin EC  81 mg Oral Daily  . atorvastatin  80 mg Oral q1800  . metoprolol tartrate  50 mg Oral BID  . sodium chloride  3 mL Intravenous Q12H  . ticagrelor  90 mg Oral BID    Infusions: . sodium chloride 125 mL/hr at 10/04/14 0800  . nitroGLYCERIN 40 mcg/min (10/04/14 0830)    PRN Medications: sodium chloride, acetaminophen, acetaminophen, hydrALAZINE, HYDROmorphone (DILAUDID) injection, nitroGLYCERIN, ondansetron (ZOFRAN) IV, sodium chloride   Assessment and Plan:   NSTEMI Pt p/w CP yesterday from [facility] with previous CABG 06/2014.  CP c/w previous event.  EKG without acute changes.  Underwent LHC yesterday with PCI to the saphenous vein graft Y graft limb with total occlusion of the mid-distal portion of the graft, treated with PCTA and DES.  LVEF 50%.  Trop trending up, now at 6.37.  Denies current CP or SOB; does have some nausea.   -cont ASA/brilinta, statin, metoprolol  -cardiac rehab   HTN -cont current meds  Dyslipidemia  -cont current meds  Prediabetes 06/2014 HA1c 6.3.  Could consider starting on metformin in addition to lifestyle modifications.  -repeat HA1c, monitor and f/u with PCP    Harvel Quale  Shiela MayerS Gill, MD PGY-3, Internal Medicine Teaching Service 10/04/2014, 9:17 AM   I have personally seen and examined this patient with Boykin PeekJacquelyn Gill, MD. I agree with the assessment and plan as outlined above. He is stable post PCI of the SVG to OM2. He is on ASA and Brilinta, statin and beta blocker. Will start Ace-inh. Will plan PCI of the SVG to PDA tomorrow per Dr. Tresa EndoKelly.   Yafet Cline 10/04/2014 9:44 AM'

## 2014-10-04 NOTE — Progress Notes (Addendum)
CARDIAC REHAB PHASE I   PRE:  Rate/Rhythm: 69 SR    BP: sitting 169/80    SaO2: 98 RA  MODE:  Ambulation: 600 ft   POST:  Rate/Rhythm: 96 SR    BP: sitting 161/76     SaO2:   Tolerated well, no c/o, no SOB (he sts this is his angina). Began discussing stents and MI. Good reception. Pt is puzzled his grafts closed. Can walk independently. [facility] and enjoying program. 1000-1101   Benjamin LovettReeve, Benjamin King Benjamin HillKristan CES, ACSM 10/04/2014 11:31 AM

## 2014-10-04 NOTE — Progress Notes (Signed)
Report given to Nellie, Rn at this time.

## 2014-10-04 NOTE — Care Management Note (Signed)
Case Management Note  Patient Details  Name: Benjamin King MRN: [id number] Date of Birth: [birth year]  Subjective/Objective:       Adm w mi             Action/Plan:  Lives w wife   Expected Discharge Date:                  Expected Discharge Plan:  Home/Self Care  In-House Referral:     Discharge planning Services  CM Consult, Medication Assistance  Post Acute Care Choice:    Choice offered to:     DME Arranged:    DME Agency:     HH Arranged:    HH Agency:     Status of Service:     Medicare Important Message Given:    Date Medicare IM Given:    Medicare IM give by:    Date Additional Medicare IM Given:    Additional Medicare Important Message give by:     If discussed at Long Length of Stay Meetings, dates discussed:    Additional Comments: gave pt brilinta 30day free and copay assist card for brilinta. Pt has [employer] ins.  Hanley Haysowell, Mykaila Blunck T, RN 10/04/2014, 9:46 AM

## 2014-10-04 NOTE — Progress Notes (Signed)
Rn Nellie from [email or web address] to call back for report

## 2014-10-04 NOTE — Progress Notes (Signed)
Post- Cath right femoral arterial sheath removed at 01:35 on 10/04/14. Pressure was held for 20 minutes. No sign of bleeding, hematoma, or bruising at site. Site is a level 0. Vital signs stable. Post- sheath removal education given to patient. Will continue to monitor.

## 2014-10-05 ENCOUNTER — Encounter (HOSPITAL_COMMUNITY): Payer: Self-pay | Admitting: General Practice

## 2014-10-05 ENCOUNTER — Encounter (HOSPITAL_COMMUNITY)
Admission: AD | Disposition: A | Discharge status: Home / Self Care | Payer: 59 | Source: Other Acute Inpatient Hospital | Attending: Cardiovascular Disease

## 2014-10-05 ENCOUNTER — Encounter (HOSPITAL_COMMUNITY)
Admission: AD | Disposition: A | Discharge status: Home / Self Care | Payer: Self-pay | Source: Other Acute Inpatient Hospital | Attending: Cardiovascular Disease

## 2014-10-05 DIAGNOSIS — I25700 Atherosclerosis of coronary artery bypass graft(s), unspecified, with unstable angina pectoris: Secondary | ICD-10-CM | POA: Insufficient documentation

## 2014-10-05 DIAGNOSIS — I25710 Atherosclerosis of autologous vein coronary artery bypass graft(s) with unstable angina pectoris: Secondary | ICD-10-CM

## 2014-10-05 HISTORY — PX: CARDIAC CATHETERIZATION: SHX172

## 2014-10-05 LAB — CBC
HCT: 35.7 % — ABNORMAL LOW (ref 39.0–52.0)
HEMOGLOBIN: 11.6 g/dL — AB (ref 13.0–17.0)
MCH: 28.7 pg (ref 26.0–34.0)
MCHC: 32.5 g/dL (ref 30.0–36.0)
MCV: 88.4 fL (ref 78.0–100.0)
PLATELETS: 265 10*3/uL (ref 150–400)
RBC: 4.04 MIL/uL — ABNORMAL LOW (ref 4.22–5.81)
RDW: 13.9 % (ref 11.5–15.5)
WBC: 9.9 10*3/uL (ref 4.0–10.5)

## 2014-10-05 LAB — BASIC METABOLIC PANEL
ANION GAP: 9 (ref 5–15)
BUN: 7 mg/dL (ref 6–20)
CALCIUM: 8.6 mg/dL — AB (ref 8.9–10.3)
CHLORIDE: 107 mmol/L (ref 101–111)
CO2: 22 mmol/L (ref 22–32)
Creatinine, Ser: 0.78 mg/dL (ref 0.61–1.24)
GLUCOSE: 100 mg/dL — AB (ref 65–99)
Potassium: 3.9 mmol/L (ref 3.5–5.1)
Sodium: 138 mmol/L (ref 135–145)

## 2014-10-05 LAB — PROTIME-INR
INR: 1.12 (ref 0.00–1.49)
Prothrombin Time: 14.6 seconds (ref 11.6–15.2)

## 2014-10-05 LAB — TROPONIN I: Troponin I: 2.26 ng/mL (ref <0.031)

## 2014-10-05 LAB — HEMOGLOBIN A1C
Hgb A1c MFr Bld: 5.7 % — ABNORMAL HIGH (ref 4.8–5.6)
Mean Plasma Glucose: 117 mg/dL

## 2014-10-05 LAB — POCT ACTIVATED CLOTTING TIME: Activated Clotting Time: 546 seconds

## 2014-10-05 SURGERY — CORONARY STENT INTERVENTION

## 2014-10-05 SURGERY — CORONARY STENT INTERVENTION
Anesthesia: LOCAL

## 2014-10-05 MED ORDER — ACETAMINOPHEN 325 MG PO TABS: 650.0000 mg | ORAL_TABLET | ORAL | Status: DC | PRN

## 2014-10-05 MED ORDER — FENTANYL CITRATE (PF) 100 MCG/2ML IJ SOLN
INTRAMUSCULAR | Status: AC
  Filled 2014-10-05: qty 2

## 2014-10-05 MED ORDER — BIVALIRUDIN 250 MG IV SOLR
250.0000 mg | INTRAVENOUS | Status: DC | PRN
Start: 2014-10-05 — End: 2014-10-05
  Administered 2014-10-05: 1.75 mg/kg/h via INTRAVENOUS

## 2014-10-05 MED ORDER — ONDANSETRON HCL 4 MG/2ML IJ SOLN: 4.0000 mg | Freq: Four times a day (QID) | INTRAMUSCULAR | Status: DC | PRN

## 2014-10-05 MED ORDER — MIDAZOLAM HCL 2 MG/2ML IJ SOLN
INTRAMUSCULAR | Status: DC | PRN
  Administered 2014-10-05: 2 mg via INTRAVENOUS

## 2014-10-05 MED ORDER — BIVALIRUDIN 250 MG IV SOLR
INTRAVENOUS | Status: AC
  Filled 2014-10-05: qty 250

## 2014-10-05 MED ORDER — MIDAZOLAM HCL 2 MG/2ML IJ SOLN
INTRAMUSCULAR | Status: AC
  Filled 2014-10-05: qty 2

## 2014-10-05 MED ORDER — FENTANYL CITRATE (PF) 100 MCG/2ML IJ SOLN
INTRAMUSCULAR | Status: DC | PRN
  Administered 2014-10-05: 50 ug via INTRAVENOUS

## 2014-10-05 MED ORDER — ASPIRIN EC 81 MG PO TBEC
81.0000 mg | DELAYED_RELEASE_TABLET | Freq: Every day | ORAL | Status: DC
  Administered 2014-10-06: 10:00:00 81 mg via ORAL

## 2014-10-05 MED ORDER — IOHEXOL 350 MG/ML SOLN
INTRAVENOUS | Status: DC | PRN
  Administered 2014-10-05: 195 mL via INTRAVENOUS

## 2014-10-05 MED ORDER — BIVALIRUDIN BOLUS VIA INFUSION - CUPID
INTRAVENOUS | Status: DC | PRN
  Administered 2014-10-05: 93.225 mg via INTRAVENOUS

## 2014-10-05 MED ORDER — LIDOCAINE HCL (PF) 1 % IJ SOLN
INTRAMUSCULAR | Status: AC
  Filled 2014-10-05: qty 30

## 2014-10-05 MED ORDER — MIDAZOLAM HCL 2 MG/2ML IJ SOLN
INTRAMUSCULAR | Status: DC | PRN
  Administered 2014-10-05 (×2): 1 mg via INTRAVENOUS

## 2014-10-05 MED ORDER — SODIUM CHLORIDE 0.9 % IJ SOLN: 3.0000 mL | Freq: Two times a day (BID) | INTRAMUSCULAR | Status: DC

## 2014-10-05 MED ORDER — SODIUM CHLORIDE 0.9 % IV SOLN
INTRAVENOUS | Status: DC
  Administered 2014-10-05: 18:00:00 via INTRAVENOUS

## 2014-10-05 MED ORDER — SODIUM CHLORIDE 0.9 % IV SOLN: 250.0000 mL | INTRAVENOUS | Status: DC | PRN

## 2014-10-05 MED ORDER — FENTANYL CITRATE (PF) 100 MCG/2ML IJ SOLN
INTRAMUSCULAR | Status: AC
Start: 2014-10-05 — End: 2014-10-05
  Filled 2014-10-05: qty 2

## 2014-10-05 MED ORDER — NITROGLYCERIN 1 MG/10 ML FOR IR/CATH LAB
INTRA_ARTERIAL | Status: AC
  Filled 2014-10-05: qty 10

## 2014-10-05 MED ORDER — HEPARIN (PORCINE) IN NACL 2-0.9 UNIT/ML-% IJ SOLN
INTRAMUSCULAR | Status: AC
  Filled 2014-10-05: qty 1000

## 2014-10-05 MED ORDER — SODIUM CHLORIDE 0.9 % IJ SOLN: 3.0000 mL | INTRAMUSCULAR | Status: DC | PRN

## 2014-10-05 MED ORDER — BIVALIRUDIN 250 MG IV SOLR
1.7500 mg/kg/h | INTRAVENOUS | Status: DC
  Filled 2014-10-05: qty 250

## 2014-10-05 MED ORDER — FENTANYL CITRATE (PF) 100 MCG/2ML IJ SOLN
INTRAMUSCULAR | Status: DC | PRN
  Administered 2014-10-05 (×3): 25 ug via INTRAVENOUS

## 2014-10-05 MED ORDER — TICAGRELOR 90 MG PO TABS
90.0000 mg | ORAL_TABLET | Freq: Two times a day (BID) | ORAL | Status: DC
  Administered 2014-10-05 – 2014-10-06 (×2): 90 mg via ORAL
  Filled 2014-10-05: qty 1

## 2014-10-05 MED ORDER — DIAZEPAM 5 MG PO TABS
5.0000 mg | ORAL_TABLET | Freq: Four times a day (QID) | ORAL | Status: DC | PRN
  Administered 2014-10-05: 5 mg via ORAL
  Filled 2014-10-05: qty 1

## 2014-10-05 SURGICAL SUPPLY — 16 items
BALLN EMERGE MR 3.0X15 (BALLOONS) ×3
BALLN ~~LOC~~ TREK RX 4.5X15 (BALLOONS) ×3
BALLOON EMERGE MR 3.0X15 (BALLOONS) ×1 IMPLANT
BALLOON ~~LOC~~ TREK RX 4.5X15 (BALLOONS) ×1 IMPLANT
CATH INFINITI 5 FR LCB (CATHETERS) ×3 IMPLANT
CATH VISTA GUIDE 6FR RCB 90 CM (CATHETERS) ×3 IMPLANT
DEVICE CONTINUOUS FLUSH (MISCELLANEOUS) ×3 IMPLANT
KIT ENCORE 26 ADVANTAGE (KITS) ×6 IMPLANT
KIT HEART LEFT (KITS) ×3 IMPLANT
PACK CARDIAC CATHETERIZATION (CUSTOM PROCEDURE TRAY) ×3 IMPLANT
SHEATH PINNACLE 6F 10CM (SHEATH) ×3 IMPLANT
STENT SYNERGY DES 4X20 (Permanent Stent) ×3 IMPLANT
TRANSDUCER W/STOPCOCK (MISCELLANEOUS) ×3 IMPLANT
TUBING CIL FLEX 10 FLL-RA (TUBING) ×3 IMPLANT
WIRE ASAHI PROWATER 180CM (WIRE) ×3 IMPLANT
WIRE EMERALD 3MM-J .035X150CM (WIRE) ×3 IMPLANT

## 2014-10-05 NOTE — Interval H&P Note (Signed)
Cath Lab Visit (complete for each Cath Lab visit)  Clinical Evaluation Leading to the Procedure:   ACS: No.  Non-ACS:    Anginal Classification: CCS III  Anti-ischemic medical therapy: Maximal Therapy (2 or more classes of medications)  Non-Invasive Test Results: No non-invasive testing performed  Prior CABG: Previous CABG      History and Physical Interval Note:  10/05/2014 10:44 AM  Vonna KotykJay A Sapien  has presented today for surgery, with the diagnosis of cad  The various methods of treatment have been discussed with the patient and family. After consideration of risks, benefits and other options for treatment, the patient has consented to  Procedure(s): Coronary Stent Intervention (N/A) as a surgical intervention .  The patient's history has been reviewed, patient examined, no change in status, stable for surgery.  I have reviewed the patient's chart and labs.  Questions were answered to the patient's satisfaction.     KELLY,THOMAS A

## 2014-10-05 NOTE — Progress Notes (Signed)
Site area: right groin  Site Prior to Removal:  Level 0  Pressure Applied For 20 MINUTES    Minutes Beginning at 1500  Manual:   Yes.    Patient Status During Pull:  stable  Post Pull Groin Site:  Level 0  Post Pull Instructions Given:  Yes.    Post Pull Pulses Present:  Yes.    Dressing Applied:  Yes.    Comments: Site checked during with remainder of shift with no change, CSM's remain wnls, pedal pulse unchanged and palpable, dressing remains dry and intact.

## 2014-10-05 NOTE — H&P (View-Only) (Signed)
     SUBJECTIVE: Mild chest soreness. No SOB  BP 153/83 mmHg  Pulse 65  Temp(Src) 98.7 F (37.1 C) (Oral)  Resp 18  Ht 6\' 3"  (1.905 m)  Wt 274 lb 1.6 oz (124.331 kg)  BMI 34.26 kg/m2  SpO2 98%  Intake/Output Summary (Last 24 hours) at 10/05/14 [id number] Last data filed at 10/05/14 0700  Gross per 24 hour  Intake 2063.68 ml  Output   2575 ml  Net -511.32 ml    PHYSICAL EXAM General: Well developed, well nourished, in no acute distress. Alert and oriented x 3.  Psych:  Good affect, responds appropriately Neck: No JVD. No masses noted.  Lungs: Clear bilaterally with no wheezes or rhonci noted.  Heart: RRR with no murmurs noted. Abdomen: Bowel sounds are present. Soft, non-tender.  Extremities: No lower extremity edema.   LABS: Basic Metabolic Panel:  Recent Labs  96/06/5405/13/16 0408 10/05/14 0411  NA 138 138  K 3.7 3.9  CL 105 107  CO2 23 22  GLUCOSE 111* 100*  BUN 8 7  CREATININE 0.75 0.78  CALCIUM 8.7* 8.6*   CBC:  Recent Labs  10/04/14 0408 10/05/14 0411  WBC 13.9* 9.9  HGB 11.6* 11.6*  HCT 34.7* 35.7*  MCV 86.8 88.4  PLT 255 265   Cardiac Enzymes:  Recent Labs  10/03/14 2157 10/04/14 0408  TROPONINI 3.40* 6.37*   Fasting Lipid Panel:  Recent Labs  10/04/14 0408  CHOL 138  HDL 33*  LDLCALC 90  TRIG 73  CHOLHDL 4.2    Current Meds: . aspirin EC  81 mg Oral Daily  . atorvastatin  80 mg Oral q1800  . lisinopril  5 mg Oral Daily  . metoprolol tartrate  50 mg Oral BID  . sodium chloride  3 mL Intravenous Q12H  . ticagrelor  90 mg Oral BID    ASSESSMENT AND PLAN:  1. NSTEMI/CAD: [facility] He had CABG 06/2014. LHC 10/02/14 per Dr. Tresa EndoKelly with early graft failure. The SVG Y graft limb to OM2 was occluded and treated with PTCA/DES x 1. Plans in place for PCI of the SVG to RCA today per Dr. Tresa EndoKelly. Labs ok. Continue ASA/brilinta, statin, metoprolol, Ace-inh.  2. HTN: BP slightly elevated. Adjust meds following PCI.     3. Dyslipidemia: continue statin   MCALHANY,CHRISTOPHER  7/14/20168:33 AM

## 2014-10-05 NOTE — Progress Notes (Signed)
     SUBJECTIVE: Mild chest soreness. No SOB  BP 153/83 mmHg  Pulse 65  Temp(Src) 98.7 F (37.1 C) (Oral)  Resp 18  Ht 6' 3" (1.905 m)  Wt 274 lb 1.6 oz (124.331 kg)  BMI 34.26 kg/m2  SpO2 98%  Intake/Output Summary (Last 24 hours) at 10/05/14 0833 Last data filed at 10/05/14 0700  Gross per 24 hour  Intake 2063.68 ml  Output   2575 ml  Net -511.32 ml    PHYSICAL EXAM General: Well developed, well nourished, in no acute distress. Alert and oriented x 3.  Psych:  Good affect, responds appropriately Neck: No JVD. No masses noted.  Lungs: Clear bilaterally with no wheezes or rhonci noted.  Heart: RRR with no murmurs noted. Abdomen: Bowel sounds are present. Soft, non-tender.  Extremities: No lower extremity edema.   LABS: Basic Metabolic Panel:  Recent Labs  10/04/14 0408 10/05/14 0411  NA 138 138  K 3.7 3.9  CL 105 107  CO2 23 22  GLUCOSE 111* 100*  BUN 8 7  CREATININE 0.75 0.78  CALCIUM 8.7* 8.6*   CBC:  Recent Labs  10/04/14 0408 10/05/14 0411  WBC 13.9* 9.9  HGB 11.6* 11.6*  HCT 34.7* 35.7*  MCV 86.8 88.4  PLT 255 265   Cardiac Enzymes:  Recent Labs  10/03/14 2157 10/04/14 0408  TROPONINI 3.40* 6.37*   Fasting Lipid Panel:  Recent Labs  10/04/14 0408  CHOL 138  HDL 33*  LDLCALC 90  TRIG 73  CHOLHDL 4.2    Current Meds: . aspirin EC  81 mg Oral Daily  . atorvastatin  80 mg Oral q1800  . lisinopril  5 mg Oral Daily  . metoprolol tartrate  50 mg Oral BID  . sodium chloride  3 mL Intravenous Q12H  . ticagrelor  90 mg Oral BID    ASSESSMENT AND PLAN:  1. NSTEMI/CAD: Pt p/w CP 10/03/14 from [facility]. He had CABG 06/2014. LHC 10/02/14 per Dr. Kelly with early graft failure. The SVG Y graft limb to OM2 was occluded and treated with PTCA/DES x 1. Plans in place for PCI of the SVG to RCA today per Dr. Kelly. Labs ok. Continue ASA/brilinta, statin, metoprolol, Ace-inh.  2. HTN: BP slightly elevated. Adjust meds following PCI.     3. Dyslipidemia: continue statin   Benjamin King,Benjamin King  7/14/20168:33 AM  

## 2014-10-06 ENCOUNTER — Other Ambulatory Visit: Payer: Self-pay | Admitting: Cardiology

## 2014-10-06 ENCOUNTER — Encounter (HOSPITAL_COMMUNITY): Payer: Managed Care, Other (non HMO)

## 2014-10-06 ENCOUNTER — Telehealth: Payer: Self-pay | Admitting: Cardiology

## 2014-10-06 DIAGNOSIS — I10 Essential (primary) hypertension: Secondary | ICD-10-CM

## 2014-10-06 DIAGNOSIS — I25700 Atherosclerosis of coronary artery bypass graft(s), unspecified, with unstable angina pectoris: Secondary | ICD-10-CM

## 2014-10-06 LAB — CBC
HEMATOCRIT: 34.1 % — AB (ref 39.0–52.0)
HEMOGLOBIN: 11.2 g/dL — AB (ref 13.0–17.0)
MCH: 28.6 pg (ref 26.0–34.0)
MCHC: 32.8 g/dL (ref 30.0–36.0)
MCV: 87.2 fL (ref 78.0–100.0)
PLATELETS: 274 10*3/uL (ref 150–400)
RBC: 3.91 MIL/uL — ABNORMAL LOW (ref 4.22–5.81)
RDW: 13.7 % (ref 11.5–15.5)
WBC: 8.9 10*3/uL (ref 4.0–10.5)

## 2014-10-06 LAB — BASIC METABOLIC PANEL
Anion gap: 7 (ref 5–15)
BUN: 8 mg/dL (ref 6–20)
CO2: 24 mmol/L (ref 22–32)
Calcium: 8.7 mg/dL — ABNORMAL LOW (ref 8.9–10.3)
Chloride: 109 mmol/L (ref 101–111)
Creatinine, Ser: 0.77 mg/dL (ref 0.61–1.24)
GFR calc Af Amer: >60 mL/min (ref >60)
GFR calc non Af Amer: >60 mL/min (ref >60)
Glucose, Bld: 99 mg/dL (ref 65–99)
Potassium: 3.9 mmol/L (ref 3.5–5.1)
Sodium: 140 mmol/L (ref 135–145)

## 2014-10-06 MED ORDER — TICAGRELOR 90 MG PO TABS: 90.0000 mg | ORAL_TABLET | Freq: Two times a day (BID) | ORAL | Status: DC

## 2014-10-06 MED ORDER — ATORVASTATIN CALCIUM 80 MG PO TABS: 80.0000 mg | ORAL_TABLET | Freq: Every day | ORAL | Status: DC

## 2014-10-06 MED ORDER — NITROGLYCERIN 0.4 MG SL SUBL: 0.4000 mg | SUBLINGUAL_TABLET | SUBLINGUAL | Status: DC | PRN

## 2014-10-06 MED ORDER — FUROSEMIDE 40 MG PO TABS: 40.0000 mg | ORAL_TABLET | Freq: Every day | ORAL | Status: DC

## 2014-10-06 MED FILL — Nitroglycerin IV Soln 100 MCG/ML in D5W: INTRA_ARTERIAL | Qty: 10 | Status: AC

## 2014-10-06 MED FILL — Heparin Sodium (Porcine) 2 Unit/ML in Sodium Chloride 0.9%: INTRAMUSCULAR | Qty: 1000 | Status: AC

## 2014-10-06 MED FILL — Lidocaine HCl Local Preservative Free (PF) Inj 1%: INTRAMUSCULAR | Qty: 30 | Status: AC

## 2014-10-06 NOTE — Progress Notes (Signed)
CARDIAC REHAB PHASE I   PRE:  Rate/Rhythm: 63 SR    BP: sitting 134/79    SaO2:   MODE:  Ambulation: 1000 ft   POST:  Rate/Rhythm: 77 SR    BP: sitting 169/82     SaO2:   Tolerated well, no c/o. Ed completed with wife present. Very receptive. Pt worried this will happen again. Planning to return to [facility] in 2 weeks. Understands importance of Brilinta. [id number]   Harriet Massoneeve, Clair Alfieri Kristan CES, ACSM 10/06/2014 8:51 AM

## 2014-10-06 NOTE — Discharge Summary (Signed)
Physician Discharge Summary  Patient ID: Benjamin King MRN: [id number] DOB/AGE: 53-Jan-1962 53 y.o.  Admit date: 10/03/2014 Discharge date: 10/06/2014   Primary Cardiologist: Dr. 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 Diona BrownerMcDowell, who underwent CABG surgery in April 2016 and had a LIMA graft placed to the mid LAD, a sequential vein graft placed to the PDA and PLA branches of the RCA, and a Y graft which was a free RIMA graft to the intermediate/diagonal like vessel, and the inferior limb of the graft being a saphenous vein graft to the obtuse marginal 2 vessel. His history is also notable for HTN, HLD and prediabetes.   He developed unstable anginal symptomatology and [facility]. [facility]. Due to ongoing recurrent pain, he underwent cardiac catheterization on 10/03/2014.  He was found to have occlusion of the vein graft limb of the Y graft and underwent successful PCI with ultimate DES stenting to this vein graft to the marginal vessel. He was found to have high-grade 80% stenosis in the proximal portion of a very large vein graft to the distal RCA sequentially. This was successfully treated with PCI + DES on 10/05/14. LVF was normal on cath. He tolerated both interventions well. He had no recurrent CP. He was placed on DAPT with ASA + Brilinta and high dose statin therapy with Lipitor. His ACE-I and BB were continued. He had no post cath complications and no difficulties ambulating  with cardiac rehab. He was last seen and examined by Dr. Clifton JamesMcAlhany who determined he was stable for discharge. Post hospital f/u has been arranged with Dr. Diona BrownerMcDowell in [place] on 10/24/14. He was given a free 30 day supply of Brilinta.   Consults: None  Significant Diagnostic Studies:  LHC 10/03/14 Conclusion     Mid LAD lesion, 60% stenosed.  Dist LAD lesion, 60% stenosed.  LIMA was injected is normal in caliber, and is anatomically normal.  There is competitive flow.  RIMA was injected is normal in caliber, and is anatomically normal.  SVG was injected is normal in caliber.  There is severe focal disease in the graft.  Origin lesion, 80% stenosed.  SVG was injected .  There is severe disease in the graft.  Mid RCA lesion, 30% stenosed.  Dist RCA lesion, 80% stenosed.  Mid Graft to Dist Graft lesion, 100% stenosed. There is a 0% residual stenosis post intervention.  A drug-eluting stent was placed.  The left ventricular systolic function is normal.  Significant 3-vessel native coronary obstructive disease with tandem proximal to mid 60% LAD stenoses with a "flush and fill "phenomena seen in the mid LAD due to competitive LIMA filling; firm percent ostial marginal stenosis with segmental 9095% mid distal marginal branch stenoses of the left circumflex coronary artery; the present mid and 80% distal RCA stenosis proximal to the PDA takeoff with mild competitive filling in the mid PDA.  Patent LIMA graft supplying the mid LAD.  Y graft consisting of the superior limb being a free RIMA graft supplying the high diagonal/intermediate like vessel and  the SVG inferior limb of the Y graft totally occluded in the mid distal segment which previously had supplied the OM 2 vessel.  SVG supplying the PDA vessel with 80% focal proximal SVG stenosis.  Low-normal global LV function with an ejection fraction of 50% and subtle mid anterolateral hypocontractility.     Treatments: See  Hospital Course   Discharge Exam: Blood pressure 134/79, pulse 60, temperature 98 F (36.7 C), temperature source Oral, resp. rate 18, height  (1.905 m), weight 273 lb 13 oz (124.2 kg), SpO2 95 %.   Disposition: 01-Home or Self Care      Discharge Instructions    Diet - low sodium heart healthy    Complete by:  As directed      Increase activity slowly    Complete by:  As directed             Medication List    STOP taking these medications        simvastatin 20 MG tablet  Commonly known as:  ZOCOR      TAKE these medications        amiodarone 200 MG tablet  Commonly known as:  PACERONE  Take 2 tablets (400 mg total) by mouth 2 (two) times daily. For 7 Days, then decrease to 200 mg BID for 7 days, then decrease to 200 mg daily     aspirin EC 81 MG tablet  Take 81 mg by mouth daily.     atorvastatin 80 MG tablet  Commonly known as:  LIPITOR  Take 1 tablet (80 mg total) by mouth daily at 6 PM.     furosemide 40 MG tablet  Commonly known as:  LASIX  Take 40 mg by mouth daily.     lisinopril 5 MG tablet  Commonly known as:  PRINIVIL,ZESTRIL  Take 1 tablet (5 mg total) by mouth daily.     metoprolol 50 MG tablet  Commonly known as:  LOPRESSOR  Take 1 tablet (50 mg total) by mouth 2 (two) times daily.     MULTIVITAMIN ADULT PO  Take 1 tablet by mouth daily.     nitroGLYCERIN 0.4 MG SL tablet  Commonly known as:  NITROSTAT  Place 1 tablet (0.4 mg total) under the tongue every 5 (five) minutes x 3 doses as needed for chest pain.     ticagrelor 90 MG Tabs tablet  Commonly known as:  BRILINTA  Take 1 tablet (90 mg total) by mouth 2 (two) times daily.     ticagrelor 90 MG Tabs tablet  Commonly known as:  BRILINTA  Take 1 tablet (90 mg total) by mouth 2 (two) times daily.       Follow-up Information    Follow up with [name], MD On 10/24/2014.   Specialty:  Cardiology   Why:  3:40 pm   Contact information:   [address] [place]  [address] [phone]     TIME SPENT ON DISCHARGE, INCLUDING PHYSICIAN TIME: >30 MINUTES   Signed: Robbie Lis 10/06/2014, 9:08 AM

## 2014-10-06 NOTE — Progress Notes (Signed)
Patient Profile: 54 year old African-American male who underwent CABG surgery in April 2016 and had a LIMA graft placed to the mid LAD, a sequential vein graft placed to the PDA and PLA branches of the RCA, and a Y graft which was a free RIMA graft to the intermediate/diagonal like vessel, and the inferior limb of the graft being a saphenous vein graft to the obtuse marginal 2 vessel. He developed unstable anginal symptomatology. [facility] was [facility] to [facility] on 10/03/14. Due to ongoing recurrent pain. Catheterization was done on 10/03/2014. Refer to that report. He was found to have occlusion of the vein graft limb of the Y graft and underwent successful PCI with ultimate DES stenting to this vein graft to the marginal vessel. He was found to have high-grade 80% stenosis in the proximal portion of a very large vein graft to the distal RCA sequentially and underwent successful PCI on 10/05/14.  Subjective: No complaints. Feeling much better. No recurrent CP. Ambulating ok.   Objective: Vital signs in last 24 hours: Temp:  [97.7 F (36.5 C)-98.6 F (37 C)] 98 F (36.7 C) (07/15 0730) Pulse Rate:  [0-72] 60 (07/15 0730) Resp:  [0-34] 18 (07/15 0730) BP: (126-179)/(65-101) 134/79 mmHg (07/15 0730) SpO2:  [0 %-100 %] 95 % (07/15 0730) Weight:  [273 lb 13 oz (124.2 kg)] 273 lb 13 oz (124.2 kg) (07/15 0424) Last BM Date: 10/02/14  Intake/Output from previous day: 07/14 0701 - 07/15 0700 In: 1502.1 [P.O.:480; I.V.:1022.1] Out: 3100 [Urine:3100] Intake/Output this shift: Total I/O In: 240 [P.O.:240] Out: -   Medications Current Facility-Administered Medications  Medication Dose Route Frequency Provider Last Rate Last Dose  . 0.9 %  sodium chloride infusion   Intravenous Continuous Lennette Bihari, MD   Stopped at 10/04/14 1040  . 0.9 %  sodium chloride infusion  250 mL Intravenous PRN Lennette Bihari, MD      . 0.9 %  sodium chloride infusion   Intravenous Continuous  Lennette Bihari, MD 125 mL/hr at 10/05/14 1753    . acetaminophen (TYLENOL) tablet 650 mg  650 mg Oral Q4H PRN Marrian Salvage, MD      . acetaminophen (TYLENOL) tablet 650 mg  650 mg Oral Q4H PRN Lennette Bihari, MD   650 mg at 10/04/14 1935  . acetaminophen (TYLENOL) tablet 650 mg  650 mg Oral Q4H PRN Lennette Bihari, MD      . alum & mag hydroxide-simeth (MAALOX/MYLANTA) 200-200-20 MG/5ML suspension 30 mL  30 mL Oral Q4H PRN Vesta Mixer, MD   30 mL at 10/04/14 1935  . aspirin EC tablet 81 mg  81 mg Oral Daily Marrian Salvage, MD   81 mg at 10/04/14 0915  . aspirin EC tablet 81 mg  81 mg Oral Daily Lennette Bihari, MD   81 mg at 10/05/14 1230  . atorvastatin (LIPITOR) tablet 80 mg  80 mg Oral q1800 Lennette Bihari, MD   80 mg at 10/05/14 1910  . diazepam (VALIUM) tablet 5 mg  5 mg Oral Q6H PRN Lennette Bihari, MD   5 mg at 10/05/14 1550  . hydrALAZINE (APRESOLINE) injection 10 mg  10 mg Intravenous Q4H PRN Leeann Must, MD   10 mg at 10/04/14 0916  . HYDROmorphone (DILAUDID) injection 0.5 mg  0.5 mg Intravenous Q3H PRN Leeann Must, MD   0.5 mg at 10/04/14 1547  . lisinopril (PRINIVIL,ZESTRIL) tablet 5 mg  5 mg Oral Daily  Marrian SalvageJacquelyn S Gill, MD   5 mg at 10/05/14 1007  . metoprolol (LOPRESSOR) tablet 50 mg  50 mg Oral BID Marrian SalvageJacquelyn S Gill, MD   50 mg at 10/05/14 2246  . nitroGLYCERIN (NITROSTAT) SL tablet 0.4 mg  0.4 mg Sublingual Q5 Min x 3 PRN Marrian SalvageJacquelyn S Gill, MD      . ondansetron [facility](ZOFRAN) injection 4 mg  4 mg Intravenous Q6H PRN Lennette Biharihomas A Kelly, MD   4 mg at 10/04/14 [id number]  . ondansetron (ZOFRAN) injection 4 mg  4 mg Intravenous Q6H PRN Lennette Biharihomas A Kelly, MD      . sodium chloride 0.9 % injection 3 mL  3 mL Intravenous Q12H Lennette Biharihomas A Kelly, MD      . sodium chloride 0.9 % injection 3 mL  3 mL Intravenous PRN Lennette Biharihomas A Kelly, MD      . ticagrelor [facility](BRILINTA) tablet 90 mg  90 mg Oral BID Lennette Biharihomas A Kelly, MD   90 mg at 10/05/14 1006  . ticagrelor (BRILINTA) tablet 90 mg  90 mg Oral BID Lennette Biharihomas A Kelly, MD    90 mg at 10/05/14 2247    PE: General appearance: alert, cooperative and no distress Neck: no carotid bruit and no JVD Lungs: clear to auscultation bilaterally Heart: regular rate and rhythm, S1, S2 normal, no murmur, click, rub or gallop Extremities: no LEE Pulses: 2+ and symmetric Skin: warm and dry Neurologic: Grossly normal  Lab Results:   Recent Labs  10/04/14 0408 10/05/14 0411 10/06/14 0304  WBC 13.9* 9.9 8.9  HGB 11.6* 11.6* 11.2*  HCT 34.7* 35.7* 34.1*  PLT 255 265 274   BMET  Recent Labs  10/04/14 0408 10/05/14 0411 10/06/14 0304  NA 138 138 140  K 3.7 3.9 3.9  CL 105 107 109  CO2 23 22 24   GLUCOSE 111* 100* 99  BUN 8 7 8   CREATININE 0.75 0.78 0.77  CALCIUM 8.7* 8.6* 8.7*   PT/INR  Recent Labs  10/05/14 0525  LABPROT 14.6  INR 1.12   Cholesterol  Recent Labs  10/04/14 0408  CHOL 138     Assessment/Plan  Principal Problem:   ACS (acute coronary syndrome) Active Problems:   NSTEMI (non-ST elevated myocardial infarction)   HTN (hypertension)   Hyperlipidemia   S/P CABG (coronary artery bypass graft)   Prediabetes   Coronary artery disease involving coronary bypass graft of native heart with unstable angina pectoris   1. NSTEMI/CAD: recent CABG in April 2016 and now s/p successful PCI to the SVG Y graft limb with total occlusion of the mid distal portion of the graft, treated successfully with PTCA/DES 10/03/14, followed by successful PCI to the sequential vein graft supplying the PDA and PLA vessels of the RCA 10/05/14. LV function normal on cath. No recurrent CP. HR and BP well controlled. Continue DAPT with ASA + Brilinta, high dose statin therapy, BB and ACE-I.  Outpatient cardiac rehab.   2. HTN: BP well controlled. Continue metoprolol and lisinopril.   3. HLD: continue high dose statin therapy with Lipitor.   4. Dispo: discharge home later today.   LOS: 3 days    Brittainy M. Delmer IslamSimmons, PA-C 10/06/2014 7:47 AM   I have  personally seen and examined this patient with Robbie LisBrittainy Simmons, PA-C. I agree with the assessment and plan as outlined above. He was admitted with unstable angina. Now s/p PCI of the SVG to OM2 and SVG to PDA. Doing well. D/C home today and f/u with Dr. Diona BrownerMcDowell.  Barth Trella 10/06/2014 8:11 AM

## 2014-10-06 NOTE — Care Management Note (Signed)
Case Management Note  Patient Details  Name: Dagoberto ReefJay A Amalfitano MRN: [id number] Date of Birth: Apr 07, 1960  Subjective/Objective:            CM following for progression and d/c planning.        Action/Plan: 10/06/2014 Per Pt insurance provider this pt will not have a copay for Brilinta, pt RN informed. Pt has card for initial prescription.   Expected Discharge Date:       10/06/2014           Expected Discharge Plan:  Home/Self Care  In-House Referral:     Discharge planning Services  CM Consult, Medication Assistance Brilinta card and packet given to pt by his RN prior to d/c.  Post Acute Care Choice:    Choice offered to:     DME Arranged:    DME Agency:     HH Arranged:    HH Agency:     Status of Service:     Medicare Important Message Given:    Date Medicare IM Given:    Medicare IM give by:    Date Additional Medicare IM Given:    Additional Medicare Important Message give by:     If discussed at Long Length of Stay Meetings, dates discussed:    Additional Comments:  Starlyn SkeansRoyal, Darrik Richman U, RN 10/06/2014, 11:28 AM

## 2014-10-06 NOTE — Telephone Encounter (Signed)
DC'd today didn't have Rx for Lasix- [facility] [place].  Corine ShelterLUKE Refoel Palladino PA-C 10/06/2014 6:06 PM

## 2014-10-09 ENCOUNTER — Encounter (HOSPITAL_COMMUNITY): Payer: Managed Care, Other (non HMO)

## 2014-10-11 ENCOUNTER — Encounter (HOSPITAL_COMMUNITY): Payer: Managed Care, Other (non HMO)

## 2014-10-13 ENCOUNTER — Encounter (HOSPITAL_COMMUNITY): Payer: Managed Care, Other (non HMO)

## 2014-10-13 ENCOUNTER — Telehealth: Payer: Self-pay | Admitting: Cardiology

## 2014-10-13 NOTE — Telephone Encounter (Signed)
[name] called stating that his eyes are watering with a runny nose.  No coughing but some sneezing, no fever, off and on headache. Started having these issues over the weekend.  Had CABG 07/14/2014 wants to know What he can take that will not elevate his BP.

## 2014-10-13 NOTE — Telephone Encounter (Signed)
Left message to return call 

## 2014-10-13 NOTE — Telephone Encounter (Signed)
Discussed below with patient.  Stated that he does not have PMD close by, goes to the [place].  Advised him that most antihistamines, cough syrup, mucinex were safe, but should avoid anything with Sudafed.  Explained that medication can affect his blood pressure and heart rate.  Also, suggested that he speak with his pharmacist to make sure OTC meds would not interact with his other medications.  Patient verbalized understanding.

## 2014-10-16 ENCOUNTER — Encounter (HOSPITAL_COMMUNITY): Payer: Managed Care, Other (non HMO)

## 2014-10-18 ENCOUNTER — Encounter (HOSPITAL_COMMUNITY): Payer: Managed Care, Other (non HMO)

## 2014-10-20 ENCOUNTER — Encounter (HOSPITAL_COMMUNITY): Payer: Managed Care, Other (non HMO)

## 2014-10-23 ENCOUNTER — Encounter (HOSPITAL_COMMUNITY): Payer: Managed Care, Other (non HMO)

## 2014-10-24 ENCOUNTER — Encounter: Payer: Self-pay | Admitting: Cardiology

## 2014-10-24 ENCOUNTER — Ambulatory Visit (INDEPENDENT_AMBULATORY_CARE_PROVIDER_SITE_OTHER): Payer: Managed Care, Other (non HMO) | Admitting: Cardiology

## 2014-10-24 VITALS — BP 122/82 | HR 69 | Ht 75.0 in | Wt 273.0 lb

## 2014-10-24 DIAGNOSIS — I10 Essential (primary) hypertension: Secondary | ICD-10-CM | POA: Diagnosis not present

## 2014-10-24 DIAGNOSIS — I9789 Other postprocedural complications and disorders of the circulatory system, not elsewhere classified: Secondary | ICD-10-CM | POA: Diagnosis not present

## 2014-10-24 DIAGNOSIS — I4891 Unspecified atrial fibrillation: Secondary | ICD-10-CM

## 2014-10-24 DIAGNOSIS — I2510 Atherosclerotic heart disease of native coronary artery without angina pectoris: Secondary | ICD-10-CM | POA: Diagnosis not present

## 2014-10-24 DIAGNOSIS — E785 Hyperlipidemia, unspecified: Secondary | ICD-10-CM

## 2014-10-24 DIAGNOSIS — I222 Subsequent non-ST elevation (NSTEMI) myocardial infarction: Secondary | ICD-10-CM | POA: Diagnosis not present

## 2014-10-24 DIAGNOSIS — I214 Non-ST elevation (NSTEMI) myocardial infarction: Secondary | ICD-10-CM

## 2014-10-24 MED ORDER — LISINOPRIL 5 MG PO TABS: 5.0000 mg | ORAL_TABLET | Freq: Every day | ORAL | Status: DC

## 2014-10-24 MED ORDER — METOPROLOL TARTRATE 50 MG PO TABS: 50.0000 mg | ORAL_TABLET | Freq: Two times a day (BID) | ORAL | Status: DC

## 2014-10-24 NOTE — Progress Notes (Signed)
Cardiology Office Note  Date: 10/24/2014   ID: Benjamin King, DOB 08-04-1960, MRN [id number]  PCP: No PCP Per Patient  Primary Cardiologist: Rozann Lesches, MD   Chief Complaint  Patient presents with  . Hospitalization Follow-up    History of Present Illness: Benjamin King is a 54 y.o. male that I met for the first time back in May of this year following CABG in April with Dr. Roxan Hockey. Record review finds repeat hospitalization at [facility] in July in the setting of recurrent ACS. He underwent cardiac catheterization demonstrating occlusion of the vein graft portion of the Y graft to the obtuse marginal and also an 80% stenosis in the proximal vein graft to the PDA and PLA of the RCA, both of which were treated with DES interventions by Dr. Claiborne Billings.   He is here today with his wife for a follow-up visit. Reports compliance with his medications including DAPT. He is on high-dose Lipitor and tolerating this so far. LDL was 90. In recent hospital stay. He has had no atrial fibrillation subsequent to CABG, and we discussed stopping his amiodarone. He plans to start back at cardiac rehabilitation and try and progress further area he had been doing well prior to his recurrent ACS.  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   Past Medical History  Diagnosis Date  . Essential hypertension   . Hyperlipidemia   . CAD (coronary artery disease)     Multivessel status post CABG April 2016, DES to SVG (Y graft) to OM and SVG to PDA/PLA July 2016  . NSTEMI (non-ST elevated myocardial infarction)     April 2016 and July 2016    Past Surgical History  Procedure Laterality Date  . Right knee surgery      Trauma  . Left heart catheterization with coronary angiogram N/A 07/10/2014    Procedure: LEFT HEART CATHETERIZATION WITH CORONARY ANGIOGRAM;  Surgeon: Jettie Booze, MD;  Location: [facility] CATH  LAB;  Service: Cardiovascular;  Laterality: N/A;  . Coronary artery bypass graft N/A 07/14/2014    Procedure: CORONARY ARTERY BYPASS GRAFTING (CABG), ON PUMP, TIMES FIVE, USING BILATERAL MAMMARY ARTERIES, RIGHT GREATER SAPHENOUS VEIN HARVESTED ENDOSCOPICALLY;  Surgeon: Melrose Nakayama, MD;  Location: [place];  Service: Open Heart Surgery;  Laterality: N/A;  Bilateral Mammary Arteries  . Tee without cardioversion N/A 07/14/2014    Procedure: TRANSESOPHAGEAL ECHOCARDIOGRAM (TEE);  Surgeon: Melrose Nakayama, MD;  Location: [place];  Service: Open Heart Surgery;  Laterality: N/A;  . Cardiac catheterization N/A 10/03/2014    Procedure: Left Heart Cath and Coronary Angiography;  Surgeon: Troy Sine, MD;  Location: [place] CV LAB;  Service: Cardiovascular;  Laterality: N/A;  . Cardiac catheterization  10/03/2014    Procedure: Coronary Stent Intervention;  Surgeon: Troy Sine, MD;  Location: [place] CV LAB;  Service: Cardiovascular;;  . Cardiac catheterization N/A 10/05/2014    Procedure: Coronary Stent Intervention;  Surgeon: Troy Sine, MD;  Location: [place] CV LAB;  Service: Cardiovascular;  Laterality: N/A;    Current Outpatient Prescriptions  Medication Sig Dispense Refill  . aspirin EC 81 MG tablet Take 81 mg by mouth daily.    Marland Kitchen atorvastatin (LIPITOR) 80 MG tablet Take 1 tablet (80 mg total) by mouth daily at 6 PM. 30 tablet 5  . furosemide (LASIX) 40 MG tablet Take 1 tablet (  40 mg total) by mouth daily. 30 tablet 11  . lisinopril (PRINIVIL,ZESTRIL) 5 MG tablet Take 1 tablet (5 mg total) by mouth daily. 30 tablet 6  . metoprolol (LOPRESSOR) 50 MG tablet Take 1 tablet (50 mg total) by mouth 2 (two) times daily. 60 tablet 6  . Multiple Vitamins-Minerals (MULTIVITAMIN ADULT PO) Take 1 tablet by mouth daily.    . nitroGLYCERIN (NITROSTAT) 0.4 MG SL tablet Place 1 tablet (0.4 mg total) under the tongue every 5 (five) minutes x 3 doses as needed for chest pain. 25 tablet 2  .  ticagrelor (BRILINTA) 90 MG TABS tablet Take 1 tablet (90 mg total) by mouth 2 (two) times daily. 60 tablet 0   No current facility-administered medications for this visit.    Allergies:  Review of patient's allergies indicates no known allergies.   Social History: The patient  reports that he has quit smoking. His smoking use included Cigars. He has never used smokeless tobacco. He reports that he does not drink alcohol or use illicit drugs.   ROS:  Please see the history of present illness. Otherwise, complete review of systems is positive for occasional bruising, no major bleeding problems.  All other systems are reviewed and negative.   Physical Exam: VS:  BP 122/82 mmHg  Pulse 69  Ht 6' 3"  (1.905 m)  Wt 273 lb (123.832 kg)  BMI 34.12 kg/m2  SpO2 98%, BMI Body mass index is 34.12 kg/(m^2).  Wt Readings from Last 3 Encounters:  10/24/14 273 lb (123.832 kg)  10/06/14 273 lb 13 oz (124.2 kg)  08/29/14 276 lb 6.4 oz (125.374 kg)     General: Tall male, appears comfortable at rest. HEENT: Conjunctiva and lids normal, oropharynx clear. Neck: Supple, no elevated JVP or carotid bruits, no thyromegaly. Thorax: Well-healing sternal incision. Lungs: Clear to auscultation, nonlabored breathing at rest. Cardiac: Regular rate and rhythm, no S3 or significant systolic murmur, no pericardial rub. Abdomen: Soft, nontender, bowel sounds present, no guarding or rebound. Extremities: Trace ankle edema, distal pulses 2+. Skin: Warm and dry. Musculoskeletal: No kyphosis. Neuropsychiatric: Alert and oriented x3, affect grossly appropriate.   ECG: Tracing from 10/06/2014 showed sinus bradycardia with prolonged PR interval and anterolateral ST-T wave abnormalities suggesting ischemic change..   Recent Labwork: 07/15/2014: Magnesium 2.1 10/06/2014: BUN 8; Creatinine, Ser 0.77; Hemoglobin 11.2*; Platelets 274; Potassium 3.9; Sodium 140     Component Value Date/Time   CHOL 138 10/04/2014 0408    TRIG 73 10/04/2014 0408   HDL 33* 10/04/2014 0408   CHOLHDL 4.2 10/04/2014 0408   VLDL 15 10/04/2014 0408   LDLCALC 90 10/04/2014 0408    Assessment and Plan:  1. Multivessel CAD status post CABG in April, recurrent ACS noted recently in July with evidence of graft failure including SVG portion of Y graft to OM system and SVG to PDA and PLA of the RCA. Both areas were treated with DES by Dr. Claiborne Billings, patient is clinically stable at this time and plans to resume cardiac rehabilitation. He continues on DAPT, also high-dose statin therapy. LVEF was normal at angiography.  2. Postoperative atrial fibrillation without obvious recurrence. Plan to stop amiodarone.  3. Hyperlipidemia, now on high-dose Lipitor. Recent LDL 90. Plan follow-up lipid panel in the next two months.  4. Essential hypertension with good blood pressure control today.  Current medicines were reviewed with the patient today.  Disposition: FU with me in 1 month.   Signed, Satira Sark, MD, [facility] 10/24/2014 4:23 PM  [facility] [place], [place], [place] [address] Phone: [phone]; Fax: [phone]

## 2014-10-24 NOTE — Patient Instructions (Signed)
   Stop Amiodarone. Continue all other medications.   Follow up in  1 month.

## 2014-10-25 ENCOUNTER — Encounter (HOSPITAL_COMMUNITY)
Admission: RE | Admit: 2014-10-25 | Discharge: 2014-10-25 | Disposition: A | Discharge status: Home / Self Care | Payer: Managed Care, Other (non HMO) | Source: Ambulatory Visit | Attending: Cardiology | Admitting: Cardiology

## 2014-10-25 DIAGNOSIS — I252 Old myocardial infarction: Secondary | ICD-10-CM | POA: Diagnosis not present

## 2014-10-25 DIAGNOSIS — Z951 Presence of aortocoronary bypass graft: Secondary | ICD-10-CM | POA: Diagnosis not present

## 2014-10-27 ENCOUNTER — Encounter (HOSPITAL_COMMUNITY)
Admission: RE | Admit: 2014-10-27 | Discharge: 2014-10-27 | Disposition: A | Discharge status: Home / Self Care | Payer: Managed Care, Other (non HMO) | Source: Ambulatory Visit | Attending: Cardiology | Admitting: Cardiology

## 2014-10-27 DIAGNOSIS — I252 Old myocardial infarction: Secondary | ICD-10-CM | POA: Diagnosis not present

## 2014-10-29 ENCOUNTER — Other Ambulatory Visit: Payer: Self-pay | Admitting: Cardiology

## 2014-10-30 ENCOUNTER — Encounter (HOSPITAL_COMMUNITY)
Admission: RE | Admit: 2014-10-30 | Discharge: 2014-10-30 | Disposition: A | Discharge status: Home / Self Care | Payer: Managed Care, Other (non HMO) | Source: Ambulatory Visit | Attending: Cardiology | Admitting: Cardiology

## 2014-10-30 DIAGNOSIS — I252 Old myocardial infarction: Secondary | ICD-10-CM | POA: Diagnosis not present

## 2014-11-01 ENCOUNTER — Encounter (HOSPITAL_COMMUNITY)
Admission: RE | Admit: 2014-11-01 | Discharge: 2014-11-01 | Disposition: A | Discharge status: Home / Self Care | Payer: Managed Care, Other (non HMO) | Source: Ambulatory Visit | Attending: Cardiology | Admitting: Cardiology

## 2014-11-01 DIAGNOSIS — I252 Old myocardial infarction: Secondary | ICD-10-CM | POA: Diagnosis not present

## 2014-11-03 ENCOUNTER — Encounter (HOSPITAL_COMMUNITY)
Admission: RE | Admit: 2014-11-03 | Discharge: 2014-11-03 | Disposition: A | Discharge status: Home / Self Care | Payer: Managed Care, Other (non HMO) | Source: Ambulatory Visit | Attending: Cardiology | Admitting: Cardiology

## 2014-11-03 DIAGNOSIS — I252 Old myocardial infarction: Secondary | ICD-10-CM | POA: Diagnosis not present

## 2014-11-06 ENCOUNTER — Encounter (HOSPITAL_COMMUNITY)
Admission: RE | Admit: 2014-11-06 | Discharge: 2014-11-06 | Disposition: A | Discharge status: Home / Self Care | Payer: Managed Care, Other (non HMO) | Source: Ambulatory Visit | Attending: Cardiology | Admitting: Cardiology

## 2014-11-06 DIAGNOSIS — I252 Old myocardial infarction: Secondary | ICD-10-CM | POA: Diagnosis not present

## 2014-11-08 ENCOUNTER — Telehealth: Payer: Self-pay | Admitting: Cardiology

## 2014-11-08 ENCOUNTER — Encounter (HOSPITAL_COMMUNITY)
Admission: RE | Admit: 2014-11-08 | Discharge: 2014-11-08 | Disposition: A | Discharge status: Home / Self Care | Payer: Managed Care, Other (non HMO) | Source: Ambulatory Visit | Attending: Cardiology | Admitting: Cardiology

## 2014-11-08 DIAGNOSIS — I252 Old myocardial infarction: Secondary | ICD-10-CM | POA: Diagnosis not present

## 2014-11-08 NOTE — Progress Notes (Signed)
Cardiac Rehabilitation Program Outcomes Report   Orientation:  08/29/14 Graduate Date:  tbd Discharge Date:  tbd # of sessions completed: 18  Cardiologist: Hermina Staggers MD:  No PCP Class Time:  0930  A.  Exercise Program:  Tolerates exercise @ 3.76 METS for 15 minutes  B.  Mental Health:  Good mental attitude  C.  Education/Instruction/Skills  Accurately checks own pulse.  Rest:  65  Exercise:  87 and Knows THR for exercise  Uses Perceived Exertion Scale and/or Dyspnea Scale  D.  Nutrition/Weight Control/Body Composition:  Adherence to prescribed nutrition program: fair    E.  Blood Lipids    Lab Results  Component Value Date   CHOL 138 10/04/2014   HDL 33* 10/04/2014   LDLCALC 90 10/04/2014   TRIG 73 10/04/2014   CHOLHDL 4.2 10/04/2014    F.  Lifestyle Changes:  Making positive lifestyle changes  G.  Symptoms noted with exercise:  Asymptomatic  Report Completed By:  Doretha Sou RN   Comments:  This is the patients halfway progress note for [facility].

## 2014-11-08 NOTE — Telephone Encounter (Signed)
Patient c/o itching all over and more on his arms that is accompanied by fine bumps. Patient said he has not changed detergents, deodorants, soap or cologne. Patient said he took one dose of benadryl and it seemed to help his rash. Nurse advised patient to continue taking the benadryl to see if the rash will clear up without having to stop any of his medications. Nurse advised patient that the only way to determine if its one of his medications causing the rash would be to come of the medication and that was not the best option for now. Patient agreed to stay on his medications, and continue the benadryl and will call office back if his rash didn't clear up.

## 2014-11-08 NOTE — Telephone Encounter (Signed)
Benjamin King called stating that he has been itching all over for the past week. Wanting to know if any of the medications that he Is on could be causing this.  Please call # [phone]

## 2014-11-10 ENCOUNTER — Encounter (HOSPITAL_COMMUNITY)
Admission: RE | Admit: 2014-11-10 | Discharge: 2014-11-10 | Disposition: A | Discharge status: Home / Self Care | Payer: Managed Care, Other (non HMO) | Source: Ambulatory Visit | Attending: Cardiology | Admitting: Cardiology

## 2014-11-10 DIAGNOSIS — I252 Old myocardial infarction: Secondary | ICD-10-CM | POA: Diagnosis not present

## 2014-11-13 ENCOUNTER — Encounter (HOSPITAL_COMMUNITY)
Admission: RE | Admit: 2014-11-13 | Discharge: 2014-11-13 | Disposition: A | Discharge status: Home / Self Care | Payer: Managed Care, Other (non HMO) | Source: Ambulatory Visit | Attending: Cardiology | Admitting: Cardiology

## 2014-11-13 DIAGNOSIS — I252 Old myocardial infarction: Secondary | ICD-10-CM | POA: Diagnosis not present

## 2014-11-15 ENCOUNTER — Encounter (HOSPITAL_COMMUNITY)
Admission: RE | Admit: 2014-11-15 | Discharge: 2014-11-15 | Disposition: A | Discharge status: Home / Self Care | Payer: Managed Care, Other (non HMO) | Source: Ambulatory Visit | Attending: Cardiology | Admitting: Cardiology

## 2014-11-15 DIAGNOSIS — I252 Old myocardial infarction: Secondary | ICD-10-CM | POA: Diagnosis not present

## 2014-11-17 ENCOUNTER — Encounter (HOSPITAL_COMMUNITY)
Admission: RE | Admit: 2014-11-17 | Discharge: 2014-11-17 | Disposition: A | Discharge status: Home / Self Care | Payer: Managed Care, Other (non HMO) | Source: Ambulatory Visit | Attending: Cardiology | Admitting: Cardiology

## 2014-11-17 DIAGNOSIS — I252 Old myocardial infarction: Secondary | ICD-10-CM | POA: Diagnosis not present

## 2014-11-20 ENCOUNTER — Encounter (HOSPITAL_COMMUNITY)
Admission: RE | Admit: 2014-11-20 | Discharge: 2014-11-20 | Disposition: A | Discharge status: Home / Self Care | Payer: Managed Care, Other (non HMO) | Source: Ambulatory Visit | Attending: Cardiology | Admitting: Cardiology

## 2014-11-20 DIAGNOSIS — I252 Old myocardial infarction: Secondary | ICD-10-CM | POA: Diagnosis not present

## 2014-11-22 ENCOUNTER — Encounter (HOSPITAL_COMMUNITY)
Admission: RE | Admit: 2014-11-22 | Discharge: 2014-11-22 | Disposition: A | Discharge status: Home / Self Care | Payer: Managed Care, Other (non HMO) | Source: Ambulatory Visit | Attending: Cardiology | Admitting: Cardiology

## 2014-11-22 DIAGNOSIS — I252 Old myocardial infarction: Secondary | ICD-10-CM | POA: Diagnosis not present

## 2014-11-23 ENCOUNTER — Encounter: Payer: Self-pay | Admitting: Cardiology

## 2014-11-23 ENCOUNTER — Ambulatory Visit (INDEPENDENT_AMBULATORY_CARE_PROVIDER_SITE_OTHER): Payer: Managed Care, Other (non HMO) | Admitting: Cardiology

## 2014-11-23 VITALS — BP 116/68 | HR 60 | Ht 75.0 in | Wt 281.4 lb

## 2014-11-23 DIAGNOSIS — E785 Hyperlipidemia, unspecified: Secondary | ICD-10-CM | POA: Diagnosis not present

## 2014-11-23 DIAGNOSIS — I10 Essential (primary) hypertension: Secondary | ICD-10-CM

## 2014-11-23 DIAGNOSIS — I2510 Atherosclerotic heart disease of native coronary artery without angina pectoris: Secondary | ICD-10-CM | POA: Diagnosis not present

## 2014-11-23 NOTE — Patient Instructions (Signed)
Your physician recommends that you continue on your current medications as directed. Please refer to the Current Medication list given to you today. Your physician recommends that you schedule a follow-up appointment in: 3 months.  Please contact our office to let us know if your cholesterol was checked by the [facility] clinic.

## 2014-11-23 NOTE — Progress Notes (Signed)
Cardiology Office Note  Date: 11/23/2014   ID: OLAJUWON King, DOB [birth year], MRN [id number]  PCP: [facility]  Primary Cardiologist: [name], MD   Chief Complaint  Patient presents with  . Coronary Artery Disease    History of Present Illness: Benjamin King is a 54 y.o. male last seen in early August. History includes multivessel CAD status post CABG in April, recurrent ACS noted recently in July with evidence of graft failure including SVG portion of Y graft to OM system and SVG to PDA and PLA of the RCA. Both areas were treated with DES by Dr. Tresa Endo.  He continues in cardiac rehabilitation. Overall doing well. He reports compliance with his medications and just recently had lab work through the [place] clinic in [place]. He plans to bring by the results, was not sure if he had lipids obtained or not.  We reviewed his medications, lisinopril dose has been increased. Blood pressure is normal today.   Past Medical History  Diagnosis Date  . Essential hypertension   . Hyperlipidemia   . CAD (coronary artery disease)     Multivessel status post CABG April 2016, DES to SVG (Y graft) to OM and SVG to PDA/PLA July 2016  . NSTEMI (non-ST elevated myocardial infarction)     April 2016 and July 2016    Past Surgical History  Procedure Laterality Date  . Right knee surgery      Trauma  . Left heart catheterization with coronary angiogram N/A 07/10/2014    Procedure: LEFT HEART CATHETERIZATION WITH CORONARY ANGIOGRAM;  Surgeon: Corky Crafts, MD;  Location: [facility] CATH LAB;  Service: Cardiovascular;  Laterality: N/A;  . Coronary artery bypass graft N/A 07/14/2014    Procedure: CORONARY ARTERY BYPASS GRAFTING (CABG), ON PUMP, TIMES FIVE, USING BILATERAL MAMMARY ARTERIES, RIGHT GREATER SAPHENOUS VEIN HARVESTED ENDOSCOPICALLY;  Surgeon: Loreli Slot, MD;  Location: [facility] OR;  Service: Open Heart Surgery;  Laterality: N/A;  Bilateral Mammary Arteries  . Tee without  cardioversion N/A 07/14/2014    Procedure: TRANSESOPHAGEAL ECHOCARDIOGRAM (TEE);  Surgeon: Loreli Slot, MD;  Location: [facility] OR;  Service: Open Heart Surgery;  Laterality: N/A;  . Cardiac catheterization N/A 10/03/2014    Procedure: Left Heart Cath and Coronary Angiography;  Surgeon: Lennette Bihari, MD;  Location: [facility] INVASIVE CV LAB;  Service: Cardiovascular;  Laterality: N/A;  . Cardiac catheterization  10/03/2014    Procedure: Coronary Stent Intervention;  Surgeon: Lennette Bihari, MD;  Location: [facility] INVASIVE CV LAB;  Service: Cardiovascular;;  . Cardiac catheterization N/A 10/05/2014    Procedure: Coronary Stent Intervention;  Surgeon: Lennette Bihari, MD;  Location: [facility] INVASIVE CV LAB;  Service: Cardiovascular;  Laterality: N/A;    Current Outpatient Prescriptions  Medication Sig Dispense Refill  . aspirin EC 81 MG tablet Take 81 mg by mouth daily.    Marland Kitchen atorvastatin (LIPITOR) 80 MG tablet Take 1 tablet (80 mg total) by mouth daily at 6 PM. 30 tablet 5  . BRILINTA 90 MG TABS tablet TAKE ONE TABLET BY MOUTH TWICE DAILY 60 tablet 6  . furosemide (LASIX) 40 MG tablet Take 1 tablet (40 mg total) by mouth daily. 30 tablet 11  . lisinopril (PRINIVIL,ZESTRIL) 10 MG tablet Take 10 mg by mouth daily.    . metoprolol (LOPRESSOR) 50 MG tablet Take 1 tablet (50 mg total) by mouth 2 (two) times daily. 60 tablet 6  . Multiple Vitamins-Minerals (MULTIVITAMIN ADULT PO) Take 1 tablet by mouth  daily.    . nitroGLYCERIN (NITROSTAT) 0.4 MG SL tablet Place 1 tablet (0.4 mg total) under the tongue every 5 (five) minutes x 3 doses as needed for chest pain. 25 tablet 2   No current facility-administered medications for this visit.    Allergies:  Review of patient's allergies indicates no known allergies.   Social History: The patient  reports that he has quit smoking. His smoking use included Cigars. He has never used smokeless tobacco. He reports that he does not drink alcohol or use illicit drugs.   ROS:   Please see the history of present illness. Otherwise, complete review of systems is positive for occasional pruritus on arms and chest - no rash.  All other systems are reviewed and negative.   Physical Exam: VS:  BP 116/68 mmHg  Pulse 60  Ht 6\' 3"  (1.905 m)  Wt 281 lb 6.4 oz (127.642 kg)  BMI 35.17 kg/m2  SpO2 98%, BMI Body mass index is 35.17 kg/(m^2).  Wt Readings from Last 3 Encounters:  11/23/14 281 lb 6.4 oz (127.642 kg)  10/24/14 273 lb (123.832 kg)  10/06/14 273 lb 13 oz (124.2 kg)     General: Tall male, appears comfortable at rest. HEENT: Conjunctiva and lids normal, oropharynx clear. Neck: Supple, no elevated JVP or carotid bruits, no thyromegaly. Thorax: Well-healing sternal incision. Lungs: Clear to auscultation, nonlabored breathing at rest. Cardiac: Regular rate and rhythm, no S3 or significant systolic murmur, no pericardial rub. Abdomen: Soft, nontender, bowel sounds present, no guarding or rebound. Extremities: Trace ankle edema, distal pulses 2+. Skin: Warm and dry. Musculoskeletal: No kyphosis. Neuropsychiatric: Alert and oriented x3, affect grossly appropriate.   ECG: ECG is not ordered today.   Recent Labwork: 07/15/2014: Magnesium 2.1 10/06/2014: BUN 8; Creatinine, Ser 0.77; Hemoglobin 11.2*; Platelets 274; Potassium 3.9; Sodium 140     Component Value Date/Time   CHOL 138 10/04/2014 0408   TRIG 73 10/04/2014 0408   HDL 33* 10/04/2014 0408   CHOLHDL 4.2 10/04/2014 0408   VLDL 15 10/04/2014 0408   LDLCALC 90 10/04/2014 0408    Assessment and Plan:  1. Multivessel CAD status post CABG and subsequent graft intervention as outlined above. Patient is doing well on medical therapy, and continues in cardiac rehabilitation.  2. Hyperlipidemia, remains on Lipitor 80 mg daily. Will obtain recent lab work from the [facility] system for review.  3. Essential hypertension, blood pressure is normal today.  Current medicines were reviewed with the patient  today.  Disposition: FU with me in 3 months.   Signed, Jonelle Sidle, MD, [facility] 11/23/2014 2:42 PM    [facility] at [facility] [address] [place], [place], [place] [address] Phone: [phone]; Fax: [phone]

## 2014-11-24 ENCOUNTER — Encounter (HOSPITAL_COMMUNITY)
Admission: RE | Admit: 2014-11-24 | Discharge: 2014-11-24 | Disposition: A | Discharge status: Home / Self Care | Payer: Managed Care, Other (non HMO) | Source: Ambulatory Visit | Attending: Cardiology | Admitting: Cardiology

## 2014-11-24 DIAGNOSIS — Z951 Presence of aortocoronary bypass graft: Secondary | ICD-10-CM | POA: Diagnosis not present

## 2014-11-24 DIAGNOSIS — I252 Old myocardial infarction: Secondary | ICD-10-CM | POA: Diagnosis present

## 2014-11-27 ENCOUNTER — Encounter (HOSPITAL_COMMUNITY): Payer: Managed Care, Other (non HMO)

## 2014-11-29 ENCOUNTER — Encounter (HOSPITAL_COMMUNITY)
Admission: RE | Admit: 2014-11-29 | Discharge: 2014-11-29 | Disposition: A | Discharge status: Home / Self Care | Payer: Managed Care, Other (non HMO) | Source: Ambulatory Visit | Attending: Cardiology | Admitting: Cardiology

## 2014-11-29 DIAGNOSIS — I252 Old myocardial infarction: Secondary | ICD-10-CM | POA: Diagnosis not present

## 2014-12-01 ENCOUNTER — Encounter (HOSPITAL_COMMUNITY): Payer: Managed Care, Other (non HMO)

## 2014-12-04 ENCOUNTER — Encounter (HOSPITAL_COMMUNITY): Payer: Managed Care, Other (non HMO)

## 2014-12-06 ENCOUNTER — Encounter (HOSPITAL_COMMUNITY)
Admission: RE | Admit: 2014-12-06 | Discharge: 2014-12-06 | Disposition: A | Discharge status: Home / Self Care | Payer: Managed Care, Other (non HMO) | Source: Ambulatory Visit | Attending: Cardiology | Admitting: Cardiology

## 2014-12-06 DIAGNOSIS — I252 Old myocardial infarction: Secondary | ICD-10-CM | POA: Diagnosis not present

## 2014-12-08 ENCOUNTER — Encounter (HOSPITAL_COMMUNITY)
Admission: RE | Admit: 2014-12-08 | Discharge: 2014-12-08 | Disposition: A | Discharge status: Home / Self Care | Payer: Managed Care, Other (non HMO) | Source: Ambulatory Visit | Attending: Cardiology | Admitting: Cardiology

## 2014-12-08 DIAGNOSIS — I252 Old myocardial infarction: Secondary | ICD-10-CM | POA: Diagnosis not present

## 2014-12-11 ENCOUNTER — Encounter (HOSPITAL_COMMUNITY): Payer: Managed Care, Other (non HMO)

## 2014-12-13 ENCOUNTER — Encounter (HOSPITAL_COMMUNITY)
Admission: RE | Admit: 2014-12-13 | Discharge: 2014-12-13 | Disposition: A | Discharge status: Home / Self Care | Payer: Managed Care, Other (non HMO) | Source: Ambulatory Visit | Attending: Cardiology | Admitting: Cardiology

## 2014-12-13 DIAGNOSIS — I252 Old myocardial infarction: Secondary | ICD-10-CM | POA: Diagnosis not present

## 2014-12-13 NOTE — Progress Notes (Signed)
Faxed a copy of progress of BP to Dr Senaida Ores, [facility].  Talked with Morrie Sheldon RN to let her know Saba's diastolic bp has been in 90s resting and with exercise.

## 2014-12-15 ENCOUNTER — Encounter (HOSPITAL_COMMUNITY)
Admission: RE | Admit: 2014-12-15 | Discharge: 2014-12-15 | Disposition: A | Discharge status: Home / Self Care | Payer: Managed Care, Other (non HMO) | Source: Ambulatory Visit | Attending: Cardiology | Admitting: Cardiology

## 2014-12-15 DIAGNOSIS — I252 Old myocardial infarction: Secondary | ICD-10-CM | POA: Diagnosis not present

## 2014-12-18 ENCOUNTER — Encounter (HOSPITAL_COMMUNITY)
Admission: RE | Admit: 2014-12-18 | Discharge: 2014-12-18 | Disposition: A | Discharge status: Home / Self Care | Payer: Managed Care, Other (non HMO) | Source: Ambulatory Visit | Attending: Cardiology | Admitting: Cardiology

## 2014-12-18 DIAGNOSIS — I252 Old myocardial infarction: Secondary | ICD-10-CM | POA: Diagnosis not present

## 2014-12-20 ENCOUNTER — Encounter (HOSPITAL_COMMUNITY)
Admission: RE | Admit: 2014-12-20 | Discharge: 2014-12-20 | Disposition: A | Discharge status: Home / Self Care | Payer: Managed Care, Other (non HMO) | Source: Ambulatory Visit | Attending: Cardiology | Admitting: Cardiology

## 2014-12-20 DIAGNOSIS — I252 Old myocardial infarction: Secondary | ICD-10-CM | POA: Diagnosis not present

## 2014-12-22 ENCOUNTER — Encounter (HOSPITAL_COMMUNITY)
Admission: RE | Admit: 2014-12-22 | Discharge: 2014-12-22 | Disposition: A | Discharge status: Home / Self Care | Payer: Managed Care, Other (non HMO) | Source: Ambulatory Visit | Attending: Cardiology | Admitting: Cardiology

## 2014-12-22 DIAGNOSIS — I252 Old myocardial infarction: Secondary | ICD-10-CM | POA: Diagnosis not present

## 2014-12-25 ENCOUNTER — Encounter (HOSPITAL_COMMUNITY)
Admission: RE | Admit: 2014-12-25 | Discharge: 2014-12-25 | Disposition: A | Discharge status: Home / Self Care | Payer: Managed Care, Other (non HMO) | Source: Ambulatory Visit | Attending: Cardiology | Admitting: Cardiology

## 2014-12-25 DIAGNOSIS — Z951 Presence of aortocoronary bypass graft: Secondary | ICD-10-CM | POA: Insufficient documentation

## 2014-12-25 DIAGNOSIS — I252 Old myocardial infarction: Secondary | ICD-10-CM | POA: Diagnosis not present

## 2014-12-27 ENCOUNTER — Encounter (HOSPITAL_COMMUNITY): Payer: Managed Care, Other (non HMO)

## 2014-12-29 ENCOUNTER — Encounter (HOSPITAL_COMMUNITY): Payer: Managed Care, Other (non HMO)

## 2015-01-01 NOTE — Progress Notes (Signed)
Patient is discharged from [facility] program today, December 25, 2014 with 36 sessions.  He achieved LTG of 30 minutes of aerobic exercise at max met level of 6.10.  All patient vitals are WNL.  Patient has met with dietician.  Discharge instructions have been reviewed in detail and patient expressed an understanding of material given.  Patient plans to exercise at home and join [employer]. Cardiac Rehab will make 1 month, 6 month and 1 year call backs.  Patient had no complaints of any abnormal S/S or pain on their exit visit.  Patient finished post walk test.

## 2015-01-01 NOTE — Progress Notes (Signed)
Cardiac Rehabilitation Program Outcomes Report   Orientation:  08/29/14 Graduate Date:  12/25/14 Discharge Date:  12/25/14 # of sessions completed: 36  Cardiologist: Hermina Staggers MD:  [facility] Class Time:  0930  A.  Exercise Program:  Tolerates exercise @ 6.10 METS for 15 minutes, Walk Test Results:  Post: 3.68, Improved functional capacity  18.3 % and Improved  muscular strength  12. %  B.  Mental Health:  Good mental attitude, Quality of Life (QOL)  improvements:  Overall  39.58 %, Health/Functioning 45.45 %, Socioeconomics 15.68 %, Psych/Spiritual 36.36 %, Family 61.54 %   and PHQ-9: 5-patient did not want any counseling.   C.  Education/Instruction/Skills  Accurately checks own pulse.  Rest:  80  Exercise:  97, Knows THR for exercise, Uses Perceived Exertion Scale and/or Dyspnea Scale and Attended 13 education classes  Home exercise given: 12/25/14  D.  Nutrition/Weight Control/Body Composition:  Adherence to prescribed nutrition program: fair  and Patient has gained 7.8 kg   E.  Blood Lipids    Lab Results  Component Value Date   CHOL 138 10/04/2014   HDL 33* 10/04/2014   LDLCALC 90 10/04/2014   TRIG 73 10/04/2014   CHOLHDL 4.2 10/04/2014    F.  Lifestyle Changes:  Making positive lifestyle changes, Resumed smoking and Needs physician encouragement on making lifestyle changes  G.  Symptoms noted with exercise:  Asymptomatic  Report Completed By:  Doretha Sou RN    Comments:  This is the patients graduation note for [facility].  Patient done very well in program.  Plans to exercise at home and at local gym.

## 2015-01-15 ENCOUNTER — Telehealth: Payer: Self-pay | Admitting: Cardiology

## 2015-01-15 NOTE — Telephone Encounter (Signed)
[name] called stating that he thinks the BRILINTA 90 MG  Is causing shortness of breath . Since he finished his cardiac rehabilitation approximately one month ago He states that the shortness of breath has become worse.    Please call his home #

## 2015-01-15 NOTE — Telephone Encounter (Signed)
Pt c/o SOB since LOV in September with walking and daily activities. Denies CP/dizziness. Will forward to Dr. Diona BrownerMcDowell however pt aware that will not call back until tomorrow. Pt aware that if other symptoms develop or SOB worsens to report to ED

## 2015-01-16 MED ORDER — CLOPIDOGREL BISULFATE 75 MG PO TABS: 75.0000 mg | ORAL_TABLET | Freq: Every day | ORAL | Status: DC

## 2015-01-16 NOTE — Telephone Encounter (Signed)
Pt aware and understood med change. Updated med list and sent Plavix to pharmacy. Pt will call office if SOB does not improve.

## 2015-01-16 NOTE — Telephone Encounter (Signed)
Shortness of breath can be experienced on Brilinta. We can try and switch him to Plavix 75 mg daily. If this does not lead to improvement however, he should let us know.

## 2015-02-27 ENCOUNTER — Encounter: Payer: Self-pay | Admitting: Cardiology

## 2015-02-27 ENCOUNTER — Ambulatory Visit (INDEPENDENT_AMBULATORY_CARE_PROVIDER_SITE_OTHER): Payer: Managed Care, Other (non HMO) | Admitting: Cardiology

## 2015-02-27 VITALS — BP 159/83 | HR 71 | Ht 75.0 in | Wt 298.8 lb

## 2015-02-27 DIAGNOSIS — E785 Hyperlipidemia, unspecified: Secondary | ICD-10-CM | POA: Diagnosis not present

## 2015-02-27 DIAGNOSIS — I10 Essential (primary) hypertension: Secondary | ICD-10-CM

## 2015-02-27 DIAGNOSIS — I2510 Atherosclerotic heart disease of native coronary artery without angina pectoris: Secondary | ICD-10-CM

## 2015-02-27 NOTE — Progress Notes (Signed)
Cardiology Office Note  Date: 02/27/2015   ID: Benjamin King, DOB 1960/07/26, MRN [id number]  PCP: Wallace Cullens, MD  Primary Cardiologist: [name], MD   Chief Complaint  Patient presents with  . Coronary Artery Disease   History of Present Illness: Benjamin King is a 54 y.o. male last seen in September. He presents for a routine follow-up visit. No angina symptoms reported. He completed cardiac rehabilitation back in October. Now he is walking up to 3 miles at a time on a treadmill and local gym. He reports no limiting shortness of breath with this and seems to have fairly good stamina.  Since last visit he was taken off Brilinta and switched to Plavix due to shortness of breath. He states that symptoms improved significantly with this change. Otherwise we reviewed his medications which are outlined below and he has had no other major changes.  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 I did let him know that a driving job with restricted lifting responsibilities would still be an option as long as he followed up regularly for DOT physicals and stress testing.  Past Medical History  Diagnosis Date  . Essential hypertension   . Hyperlipidemia   . CAD (coronary artery disease)     Multivessel status post CABG April 2016, DES to SVG (Y graft) to OM and SVG to PDA/PLA July 2016  . NSTEMI (non-ST elevated myocardial infarction) [facility])     April 2016 and July 2016    Past Surgical History  Procedure Laterality Date  . Right knee surgery      Trauma  . Left heart catheterization with coronary angiogram N/A 07/10/2014    Procedure: LEFT HEART CATHETERIZATION WITH CORONARY ANGIOGRAM;  Surgeon: Corky Crafts, MD;  Location: [facility] CATH LAB;  Service: Cardiovascular;  Laterality: N/A;  . Coronary artery bypass  graft N/A 07/14/2014    Procedure: CORONARY ARTERY BYPASS GRAFTING (CABG), ON PUMP, TIMES FIVE, USING BILATERAL MAMMARY ARTERIES, RIGHT GREATER SAPHENOUS VEIN HARVESTED ENDOSCOPICALLY;  Surgeon: Loreli Slot, MD;  Location: [facility] OR;  Service: Open Heart Surgery;  Laterality: N/A;  Bilateral Mammary Arteries  . Tee without cardioversion N/A 07/14/2014    Procedure: TRANSESOPHAGEAL ECHOCARDIOGRAM (TEE);  Surgeon: Loreli Slot, MD;  Location: [facility] OR;  Service: Open Heart Surgery;  Laterality: N/A;  . Cardiac catheterization N/A 10/03/2014    Procedure: Left Heart Cath and Coronary Angiography;  Surgeon: Lennette Bihari, MD;  Location: [facility] INVASIVE CV LAB;  Service: Cardiovascular;  Laterality: N/A;  . Cardiac catheterization  10/03/2014    Procedure: Coronary Stent Intervention;  Surgeon: Lennette Bihari, MD;  Location: [facility] INVASIVE CV LAB;  Service: Cardiovascular;;  . Cardiac catheterization N/A 10/05/2014    Procedure: Coronary Stent Intervention;  Surgeon: Lennette Bihari, MD;  Location: [facility] INVASIVE CV LAB;  Service: Cardiovascular;  Laterality: N/A;    Current Outpatient Prescriptions  Medication Sig Dispense Refill  . aspirin EC 81 MG tablet Take 81 mg by mouth daily.    Marland Kitchen atorvastatin (LIPITOR) 80 MG tablet Take 1 tablet (80 mg total) by mouth daily at 6 PM. 30 tablet 5  . Cholecalciferol (VITAMIN D-3) 1000 UNITS CAPS Take 1 capsule by mouth daily.    . clopidogrel (PLAVIX) 75 MG tablet Take 1 tablet (75 mg total)  by mouth daily. 90 tablet 3  . furosemide (LASIX) 40 MG tablet Take 1 tablet (40 mg total) by mouth daily. 30 tablet 11  . lisinopril (PRINIVIL,ZESTRIL) 2.5 MG tablet Take 1 tablet by mouth daily.    Marland Kitchen. lisinopril (PRINIVIL,ZESTRIL) 20 MG tablet Take 20 mg by mouth daily.    . metoprolol (LOPRESSOR) 50 MG tablet Take 1 tablet (50 mg total) by mouth 2 (two) times daily. 60 tablet 6  . Multiple Vitamins-Minerals (MULTIVITAMIN ADULT PO) Take 1 tablet by mouth daily.    .  nitroGLYCERIN (NITROSTAT) 0.4 MG SL tablet Place 1 tablet (0.4 mg total) under the tongue every 5 (five) minutes x 3 doses as needed for chest pain. 25 tablet 2   No current facility-administered medications for this visit.   Allergies:  Review of patient's allergies indicates no known allergies.   Social History: The patient  reports that he has quit smoking. His smoking use included Cigars. He has never used smokeless tobacco. He reports that he does not drink alcohol or use illicit drugs.   ROS:  Please see the history of present illness. Otherwise, complete review of systems is positive for sometimes paresthesias in his right arm if he lies on his right side. Also restless legs..  All other systems are reviewed and negative.   Physical Exam: VS:  BP 159/83 mmHg  Pulse 71  Ht 6\' 3"  (1.905 m)  Wt 298 lb 12.8 oz (135.535 kg)  BMI 37.35 kg/m2  SpO2 98%, BMI Body mass index is 37.35 kg/(m^2).  Wt Readings from Last 3 Encounters:  02/27/15 298 lb 12.8 oz (135.535 kg)  11/23/14 281 lb 6.4 oz (127.642 kg)  10/24/14 273 lb (123.832 kg)    General: Tall male, appears comfortable at rest. HEENT: Conjunctiva and lids normal, oropharynx clear. Neck: Supple, no elevated JVP or carotid bruits, no thyromegaly. Thorax: Well-healing sternal incision. Lungs: Clear to auscultation, nonlabored breathing at rest. Cardiac: Regular rate and rhythm, no S3 or significant systolic murmur, no pericardial rub. Abdomen: Soft, nontender, bowel sounds present, no guarding or rebound. Extremities: Trace ankle edema, distal pulses 2+.  ECG: ECG is not ordered today.  Recent Labwork: 07/15/2014: Magnesium 2.1 10/06/2014: BUN 8; Creatinine, Ser 0.77; Hemoglobin 11.2*; Platelets 274; Potassium 3.9; Sodium 140     Component Value Date/Time   CHOL 138 10/04/2014 0408   TRIG 73 10/04/2014 0408   HDL 33* 10/04/2014 0408   CHOLHDL 4.2 10/04/2014 0408   VLDL 15 10/04/2014 0408   LDLCALC 90 10/04/2014 0408    September 2016: BUN 13, creatinine 0.7, potassium 3.9, hemoglobin 12.0, platelets 296 August 2016: AST 14, ALT 22, cholesterol 107, triglycerides 95, HDL 36, LDL 52, TSH 2.1  Assessment and Plan:  1. Multivessel CAD status post CABG with subsequent graft intervention as outlined above. He completed cardiac rehabilitation and is now exercising on his own. Remains symptomatically stable on medical therapy. We will continue observation.  2. Hyperlipidemia, continues on statin therapy. LDL was 52 as of August.  3. Essential hypertension, blood pressure is elevated today. Continue current regimen and follow trend in case additional adjustments need to be made. Keep follow-up with primary care.  Current medicines were reviewed with the patient today.  Disposition: FU with me in 6 months.   Signed, Jonelle SidleSamuel G. Kayliee Atienza, MD, [facility] 02/27/2015 1:57 PM    [facility] [address] [place], [place], [place] [address] Phone: [phone](336) [phone]; Fax: [phone]

## 2015-02-27 NOTE — Patient Instructions (Signed)
Your physician wants you to follow-up in: 6 months with Dr. McDowell You will receive a reminder letter in the mail two months in advance. If you don't receive a letter, please call our office to schedule the follow-up appointment.  Your physician recommends that you continue on your current medications as directed. Please refer to the Current Medication list given to you today.  Thank you for choosing [facility]!!    

## 2015-04-05 ENCOUNTER — Other Ambulatory Visit: Payer: Self-pay | Admitting: Cardiology

## 2015-05-02 ENCOUNTER — Telehealth: Payer: Self-pay | Admitting: Cardiology

## 2015-05-02 NOTE — Telephone Encounter (Signed)
Received call from Dr. Elige Radon, hospitalist at [facility], regarding patient. He was admitted with severe anemia, Hgb in 7 range, and found to have heme positive stool suggesting GI bleed. ASA and Plavix stopped temporarily in anticipation of colonoscopy. Patient concerned and wanted me to know. Although not ideal since less than one year since last DES intervention, it has been over 6 months and with active bleeding and a need to colonoscopy this makes sense and is reasonable. Would aim to resume antiplatelet therapy as soon as reasonably possible, and this will depend on source of bleeding and likelyhood of correction and recurrence.

## 2015-05-09 ENCOUNTER — Encounter: Payer: Self-pay | Admitting: *Deleted

## 2015-05-10 ENCOUNTER — Encounter: Payer: Managed Care, Other (non HMO) | Admitting: Cardiology

## 2015-05-10 ENCOUNTER — Encounter: Payer: Self-pay | Admitting: Cardiology

## 2015-05-10 NOTE — Progress Notes (Signed)
No show  This encounter was created in error - please disregard.

## 2015-07-17 ENCOUNTER — Other Ambulatory Visit: Payer: Self-pay | Admitting: Cardiology

## 2015-07-27 ENCOUNTER — Other Ambulatory Visit: Payer: Self-pay | Admitting: Cardiology

## 2015-07-27 MED ORDER — NITROGLYCERIN 0.4 MG SL SUBL: 0.4000 mg | SUBLINGUAL_TABLET | SUBLINGUAL | Status: DC | PRN

## 2015-07-27 NOTE — Telephone Encounter (Signed)
nitroGLYCERIN (NITROSTAT) 0.4 MG SL tablet  [facility]  [place] [place]

## 2015-07-27 NOTE — Telephone Encounter (Signed)
Medication sent to pharmacy  

## 2015-09-11 ENCOUNTER — Encounter: Payer: Self-pay | Admitting: Cardiology

## 2015-09-11 ENCOUNTER — Ambulatory Visit (INDEPENDENT_AMBULATORY_CARE_PROVIDER_SITE_OTHER): Payer: BLUE CROSS/BLUE SHIELD | Admitting: Cardiology

## 2015-09-11 ENCOUNTER — Encounter: Payer: Self-pay | Admitting: *Deleted

## 2015-09-11 VITALS — BP 142/92 | HR 73 | Ht 75.0 in | Wt 296.0 lb

## 2015-09-11 DIAGNOSIS — I2510 Atherosclerotic heart disease of native coronary artery without angina pectoris: Secondary | ICD-10-CM | POA: Diagnosis not present

## 2015-09-11 DIAGNOSIS — Z8719 Personal history of other diseases of the digestive system: Secondary | ICD-10-CM

## 2015-09-11 DIAGNOSIS — E785 Hyperlipidemia, unspecified: Secondary | ICD-10-CM | POA: Diagnosis not present

## 2015-09-11 DIAGNOSIS — I10 Essential (primary) hypertension: Secondary | ICD-10-CM | POA: Diagnosis not present

## 2015-09-11 NOTE — Patient Instructions (Signed)
Medication Instructions:  Your physician recommends that you continue on your current medications as directed. Please refer to the Current Medication list given to you today.  Labwork: NONE  Testing/Procedures: NONE  Follow-Up: Your physician recommends that you schedule a follow-up appointment in: 6 months. You will receive a reminder letter in the mail in about 4 months reminding you to call and schedule your appointment. If you don't receive this letter, please contact our office.  Any Other Special Instructions Will Be Listed Below (If Applicable).  We have provided you with a handicap application for the DMV.  Please have your recent colonoscopy/EGD results sent to our office.  If you need a refill on your cardiac medications before your next appointment, please call your pharmacy.

## 2015-09-11 NOTE — Progress Notes (Signed)
Cardiology Office Note  Date: 09/11/2015   ID: Benjamin King, DOB [birth year], MRN [id number]  PCP: Wallace Cullens, MD  Primary Cardiologist: [name], MD   Chief Complaint  Patient presents with  . Coronary Artery Disease    History of Present Illness: Benjamin King is a 55 y.o. male last seen in December 2016. He presents for a routine follow-up visit. Reports no active angina symptoms and stable dyspnea on exertion. He has not been exercising as regularly of late, just recently underwent endoscopy and colonoscopy through the [facility] medical system. Records indicate hospitalization at [facility] in February of this year with GI bleed, hemoglobin down to 7.4. He required PRBC transfusion. He was temporarily off Plavix. I see that he is now on Protonix, he recalls being told that he had gastritis, possibly some colonic polyps. We will try and get the full report. He remains on DAPT with no recurrent bleeding.  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  I reviewed his medications which are outlined below. Cardiac regimen includes aspirin, Plavix, Lipitor, lisinopril, Lasix, Lopressor, and as needed nitroglycerin. He will be a year out from DES to SVG intervention in July 2016. Report indicated preference for long-term DAPT if possible.  Past Medical History  Diagnosis Date  . Essential hypertension   . Hyperlipidemia   . CAD (coronary artery disease)     Multivessel status post CABG April 2016, DES to SVG (Y graft) to OM and SVG to PDA/PLA July 2016  . NSTEMI (non-ST elevated myocardial infarction) [facility])     April 2016 and July 2016    Past Surgical History  Procedure Laterality Date  . Right knee surgery      Trauma  . Left heart catheterization with coronary angiogram N/A 07/10/2014    Procedure: LEFT HEART CATHETERIZATION WITH CORONARY ANGIOGRAM;  Surgeon: Corky Crafts, MD;  Location: [facility] CATH LAB;  Service: Cardiovascular;  Laterality:  N/A;  . Coronary artery bypass graft N/A 07/14/2014    Procedure: CORONARY ARTERY BYPASS GRAFTING (CABG), ON PUMP, TIMES FIVE, USING BILATERAL MAMMARY ARTERIES, RIGHT GREATER SAPHENOUS VEIN HARVESTED ENDOSCOPICALLY;  Surgeon: Loreli Slot, MD;  Location: [facility] OR;  Service: Open Heart Surgery;  Laterality: N/A;  Bilateral Mammary Arteries  . Tee without cardioversion N/A 07/14/2014    Procedure: TRANSESOPHAGEAL ECHOCARDIOGRAM (TEE);  Surgeon: Loreli Slot, MD;  Location: [facility] OR;  Service: Open Heart Surgery;  Laterality: N/A;  . Cardiac catheterization N/A 10/03/2014    Procedure: Left Heart Cath and Coronary Angiography;  Surgeon: Lennette Bihari, MD;  Location: [facility] INVASIVE CV LAB;  Service: Cardiovascular;  Laterality: N/A;  . Cardiac catheterization  10/03/2014    Procedure: Coronary Stent Intervention;  Surgeon: Lennette Bihari, MD;  Location[facility] INVASIVE CV LAB;  Service: Cardiovascular;;  . Cardiac catheterization N/A 10/05/2014    Procedure: Coronary Stent Intervention;  Surgeon: Lennette Bihari, MD;  Location: [facility] INVASIVE CV LAB;  Service: Cardiovascular;  Laterality: N/A;    Current Outpatient Prescriptions  Medication Sig Dispense Refill  . aspirin EC 81 MG tablet Take 81 mg by mouth daily.    Marland Kitchen atorvastatin (LIPITOR) 80 MG tablet TAKE ONE TABLET BY MOUTH ONCE DAILY AT  6 PM 30 tablet 6  . Cholecalciferol (VITAMIN D-3) 1000 UNITS CAPS Take 1 capsule by mouth daily.    . clopidogrel (PLAVIX) 75 MG tablet Take 1 tablet (75 mg total) by mouth daily. 90 tablet  3  . furosemide (LASIX) 40 MG tablet Take 1 tablet (40 mg total) by mouth daily. 30 tablet 11  . lisinopril (PRINIVIL,ZESTRIL) 20 MG tablet Take 20 mg by mouth daily.    . metoprolol (LOPRESSOR) 50 MG tablet TAKE ONE TABLET BY MOUTH TWICE DAILY 60 tablet 2  . Multiple Vitamins-Minerals (MULTIVITAMIN ADULT PO) Take 1 tablet by mouth daily.    . nitroGLYCERIN (NITROSTAT) 0.4 MG SL tablet Place 1 tablet (0.4 mg total) under the  tongue every 5 (five) minutes x 3 doses as needed for chest pain. 25 tablet 2  . pantoprazole (PROTONIX) 40 MG tablet Take 40 mg by mouth daily.    . sertraline (ZOLOFT) 50 MG tablet Take 100 mg by mouth at bedtime.     No current facility-administered medications for this visit.   Allergies:  Review of patient's allergies indicates no known allergies.   Social History: The patient  reports that he has quit smoking. His smoking use included Cigars. He has never used smokeless tobacco. He reports that he does not drink alcohol or use illicit drugs.   ROS:  Please see the history of present illness. Otherwise, complete review of systems is positive for arthritic knee pains.  All other systems are reviewed and negative.   Physical Exam: VS:  BP 142/92 mmHg  Pulse 73  Ht 6\' 3"  (1.905 m)  Wt 296 lb (134.265 kg)  BMI 37.00 kg/m2  SpO2 99%, BMI Body mass index is 37 kg/(m^2).  Wt Readings from Last 3 Encounters:  09/11/15 296 lb (134.265 kg)  02/27/15 298 lb 12.8 oz (135.535 kg)  11/23/14 281 lb 6.4 oz (127.642 kg)    General: Tall male, appears comfortable at rest. HEENT: Conjunctiva and lids normal, oropharynx clear. Neck: Supple, no elevated JVP or carotid bruits, no thyromegaly. Thorax: Well-healing sternal incision. Lungs: Clear to auscultation, nonlabored breathing at rest. Cardiac: Regular rate and rhythm, no S3 or significant systolic murmur, no pericardial rub. Abdomen: Soft, nontender, bowel sounds present, no guarding or rebound. Extremities: Trace ankle edema, distal pulses 2+.  ECG: I personally reviewed the tracing from 05/01/2015 which showed sinus rhythm with increased voltage and repolarization abnormalities.  Recent Labwork: 10/06/2014: BUN 8; Creatinine, Ser 0.77; Hemoglobin 11.2*; Platelets 274; Potassium 3.9; Sodium 140     Component Value Date/Time   CHOL 138 10/04/2014 0408   TRIG 73 10/04/2014 0408   HDL 33* 10/04/2014 0408   CHOLHDL 4.2 10/04/2014 0408    VLDL 15 10/04/2014 0408   LDLCALC 90 10/04/2014 0408  February 2017: BUN 14, creatinine 0.6, potassium 3.7, AST 13, ALT 11, troponin T negative 3, NT-pro BNP 109, hemoglobin 7.4-9.0, platelets 225  Other Studies Reviewed Today:  Cardiac catheterization 10/03/2014:  Mid LAD lesion, 60% stenosed.  Dist LAD lesion, 60% stenosed.  LIMA was injected is normal in caliber, and is anatomically normal.  There is competitive flow.  RIMA was injected is normal in caliber, and is anatomically normal.  SVG was injected is normal in caliber.  There is severe focal disease in the graft.  Origin lesion, 80% stenosed.  SVG was injected .  There is severe disease in the graft.  Mid RCA lesion, 30% stenosed.  Dist RCA lesion, 80% stenosed.  Mid Graft to Dist Graft lesion, 100% stenosed. There is a 0% residual stenosis post intervention.  A drug-eluting stent was placed.  The left ventricular systolic function is normal.  Significant 3-vessel native coronary obstructive disease with tandem proximal to mid 60%  LAD stenoses with a "flush and fill "phenomena seen in the mid LAD due to competitive LIMA filling; firm percent ostial marginal stenosis with segmental 9095% mid distal marginal branch stenoses of the left circumflex coronary artery; the present mid and 80% distal RCA stenosis proximal to the PDA takeoff with mild competitive filling in the mid PDA.  Patent LIMA graft supplying the mid LAD.  Y graft consisting of the superior limb being a free RIMA graft supplying the high diagonal/intermediate like vessel and the SVG inferior limb of the Y graft totally occluded in the mid distal segment which previously had supplied the OM 2 vessel.  SVG supplying the PDA vessel with 80% focal proximal SVG stenosis.  Low-normal global LV function with an ejection fraction of 50% and subtle mid anterolateral hypocontractility.  Successful PCI to the SVG Y graft limb with total occlusion of the  mid distal portion of the graft, treated successfully with PTCA/DES stenting with a Synergy 3.038 mm stent dilated to 3.15 mm in the 100% occlusion and TIMI 0 flow being improved to 0% stenosis with TIMI-3 flow.  Assessment and Plan:  1. Multivessel CAD status post CABG in April 2016 with subsequent DES intervention to the SVG to OM and PDA in July 2016. Continue medical therapy, discussed exercise plan. We will keep him on DAPT unless he has problems with recurrent GI bleeding.  2. Essential hypertension, no changes made to current regimen. Keep follow-up with PCP.  3. Hyperlipidemia, continues on Lipitor. Lab work followed to the [facility] system.  4. GI bleed back in March of this year. Requesting reports of EGD and colonoscopy done through the [facility] medical system just recently.  Current medicines were reviewed with the patient today.  Disposition: Follow-up with me in 6 months.  Signed, Jonelle Sidle, MD, [facility] 09/11/2015 9:12 AM    [facility] [address] [place], [place], [place] [address] Phone: [phone]; Fax: [phone]

## 2015-11-07 ENCOUNTER — Other Ambulatory Visit: Payer: Self-pay | Admitting: Cardiology

## 2015-11-07 DIAGNOSIS — I10 Essential (primary) hypertension: Secondary | ICD-10-CM

## 2015-12-10 ENCOUNTER — Other Ambulatory Visit: Payer: Self-pay | Admitting: Cardiology

## 2016-02-26 ENCOUNTER — Other Ambulatory Visit: Payer: Self-pay | Admitting: Cardiology

## 2016-05-06 ENCOUNTER — Telehealth: Payer: Self-pay | Admitting: Cardiology

## 2016-05-06 NOTE — Telephone Encounter (Signed)
Numerous attempts to contact patient with recall letters. Unable to reach by telephone. with no success.  Benjamin SalonVicky T King [[id number]][[id number]] 09/11/2015 9:30 AM New [10]    Benjamin DuffVicky T King [[id number]][[id number]] 11/22/2015 1:15 PM Notification Sent [20]   Benjamin SalonVicky T King [[id number]][[id number]] 05/06/2016 2:26 PM Notification Sent [20]

## 2016-05-08 ENCOUNTER — Other Ambulatory Visit: Payer: Self-pay | Admitting: Cardiology

## 2016-05-21 ENCOUNTER — Other Ambulatory Visit: Payer: Self-pay | Admitting: Cardiology

## 2016-05-29 ENCOUNTER — Encounter: Payer: Self-pay | Admitting: Cardiology

## 2016-06-22 ENCOUNTER — Other Ambulatory Visit: Payer: Self-pay | Admitting: Cardiology

## 2016-06-22 DIAGNOSIS — I10 Essential (primary) hypertension: Secondary | ICD-10-CM

## 2016-08-01 ENCOUNTER — Other Ambulatory Visit: Payer: Self-pay | Admitting: Cardiology

## 2016-08-01 DIAGNOSIS — I10 Essential (primary) hypertension: Secondary | ICD-10-CM

## 2016-09-01 ENCOUNTER — Other Ambulatory Visit: Payer: Self-pay | Admitting: Cardiology

## 2016-09-01 DIAGNOSIS — I10 Essential (primary) hypertension: Secondary | ICD-10-CM

## 2016-09-21 ENCOUNTER — Other Ambulatory Visit: Payer: Self-pay | Admitting: Cardiovascular Disease

## 2016-09-21 DIAGNOSIS — I10 Essential (primary) hypertension: Secondary | ICD-10-CM

## 2016-10-02 ENCOUNTER — Other Ambulatory Visit: Payer: Self-pay | Admitting: Cardiovascular Disease

## 2016-10-02 DIAGNOSIS — I10 Essential (primary) hypertension: Secondary | ICD-10-CM

## 2016-10-22 ENCOUNTER — Other Ambulatory Visit: Payer: Self-pay | Admitting: Cardiology

## 2016-10-31 ENCOUNTER — Other Ambulatory Visit: Payer: Self-pay | Admitting: Cardiology

## 2016-11-01 ENCOUNTER — Other Ambulatory Visit: Payer: Self-pay | Admitting: Cardiology

## 2016-11-01 DIAGNOSIS — I10 Essential (primary) hypertension: Secondary | ICD-10-CM

## 2016-11-04 NOTE — Progress Notes (Signed)
Cardiology Office Note  Date: 11/05/2016   ID: Benjamin King, DOB [birth year], MRN [id number]  PCP: [facility] Primary Cardiologist: [name], MD   Chief Complaint  Patient presents with  . Coronary Artery Disease    History of Present Illness: Benjamin King is a 56 y.o. male last seen in June 2017. He presents for a follow-up visit. Denies any progressive angina symptoms with typical activity or use of nitroglycerin. He states that he reinjured his left knee recently and has not been doing his typical walking plan. He had been going to the [facility] and walking indoors multiple laps on the track.  I reviewed his medications which are outlined below. Cardiac regimen includes aspirin, Plavix, Lipitor, Lasix, lisinopril, and Lopressor. He follows regularly through the [facility] medical clinic in [place], has his routine lab work obtained there.  I personally reviewed his ECG from today which shows sinus rhythm with LVH and repolarization changes, left atrial enlargement.  Past Medical History:  Diagnosis Date  . CAD (coronary artery disease)    Multivessel status post CABG April 2016, DES to SVG (Y graft) to OM and SVG to PDA/PLA July 2016  . Essential hypertension   . Hyperlipidemia   . NSTEMI (non-ST elevated myocardial infarction) [facility])    April 2016 and July 2016    Past Surgical History:  Procedure Laterality Date  . CARDIAC CATHETERIZATION N/A 10/03/2014   Procedure: Left Heart Cath and Coronary Angiography;  Surgeon: Lennette Bihari, MD;  Location: [facility] INVASIVE CV LAB;  Service: Cardiovascular;  Laterality: N/A;  . CARDIAC CATHETERIZATION  10/03/2014   Procedure: Coronary Stent Intervention;  Surgeon: Lennette Bihari, MD;  Location: [facility] INVASIVE CV LAB;  Service: Cardiovascular;;  . CARDIAC CATHETERIZATION N/A 10/05/2014   Procedure: Coronary Stent Intervention;  Surgeon: Lennette Bihari, MD;  Location: [facility] INVASIVE CV LAB;  Service: Cardiovascular;  Laterality: N/A;    . CORONARY ARTERY BYPASS GRAFT N/A 07/14/2014   Procedure: CORONARY ARTERY BYPASS GRAFTING (CABG), ON PUMP, TIMES FIVE, USING BILATERAL MAMMARY ARTERIES, RIGHT GREATER SAPHENOUS VEIN HARVESTED ENDOSCOPICALLY;  Surgeon: Loreli Slot, MD;  Location: [facility] OR;  Service: Open Heart Surgery;  Laterality: N/A;  Bilateral Mammary Arteries  . LEFT HEART CATHETERIZATION WITH CORONARY ANGIOGRAM N/A 07/10/2014   Procedure: LEFT HEART CATHETERIZATION WITH CORONARY ANGIOGRAM;  Surgeon: Corky Crafts, MD;  Location: [facility] CATH LAB;  Service: Cardiovascular;  Laterality: N/A;  . Right knee surgery     Trauma  . TEE WITHOUT CARDIOVERSION N/A 07/14/2014   Procedure: TRANSESOPHAGEAL ECHOCARDIOGRAM (TEE);  Surgeon: Loreli Slot, MD;  Location: [facility] OR;  Service: Open Heart Surgery;  Laterality: N/A;    Current Outpatient Prescriptions  Medication Sig Dispense Refill  . aspirin EC 81 MG tablet Take 81 mg by mouth daily.    Marland Kitchen atorvastatin (LIPITOR) 80 MG tablet TAKE 1 TABLET BY MOUTH ONCE DAILY AT  6PM  (NEEDS  APPT) 90 tablet 0  . Cholecalciferol (VITAMIN D-3) 1000 UNITS CAPS Take 1 capsule by mouth daily.    . clopidogrel (PLAVIX) 75 MG tablet TAKE ONE TABLET BY MOUTH ONCE DAILY 90 tablet 3  . furosemide (LASIX) 40 MG tablet TAKE 1 TABLET BY MOUTH ONCE DAILY (NEEDS  APPT) 15 tablet 0  . lisinopril (PRINIVIL,ZESTRIL) 20 MG tablet Take 20 mg by mouth daily.    . meloxicam (MOBIC) 15 MG tablet Take 15 mg by mouth daily.    . metoprolol tartrate (LOPRESSOR) 50  MG tablet TAKE 1 TABLET BY MOUTH TWICE DAILY 60 tablet 3  . Multiple Vitamins-Minerals (MULTIVITAMIN ADULT PO) Take 1 tablet by mouth daily.    . nitroGLYCERIN (NITROSTAT) 0.4 MG SL tablet Place 1 tablet (0.4 mg total) under the tongue every 5 (five) minutes x 3 doses as needed for chest pain. 25 tablet 2  . omeprazole (PRILOSEC) 20 MG capsule Take 20 mg by mouth daily.     No current facility-administered medications for this visit.     Allergies:  Patient has no known allergies.   Social History: The patient  reports that he has quit smoking. His smoking use included Cigars. He has never used smokeless tobacco. He reports that he does not drink alcohol or use drugs.   ROS:  Please see the history of present illness. Otherwise, complete review of systems is positive for improving left knee pain.  All other systems are reviewed and negative.   Physical Exam: VS:  BP (!) 168/98   Pulse 78   Ht 6\' 3"  (1.905 m)   Wt (!) 301 lb (136.5 kg)   SpO2 98%   BMI 37.62 kg/m , BMI Body mass index is 37.62 kg/m.  Wt Readings from Last 3 Encounters:  11/05/16 (!) 301 lb (136.5 kg)  09/11/15 296 lb (134.3 kg)  02/27/15 298 lb 12.8 oz (135.5 kg)    General: Tall male, appears comfortable at rest. HEENT: Conjunctiva and lids normal, oropharynx clear. Neck: Supple, no elevated JVP or carotid bruits, no thyromegaly. Thorax: Well-healing sternal incision. Lungs: Clear to auscultation, nonlabored breathing at rest. Cardiac: Regular rate and rhythm, no S3 or significant systolic murmur, no pericardial rub. Abdomen: Soft, nontender, bowel sounds present. Extremities: Trace ankle edema, distal pulses 2+.  ECG: I personally reviewed the tracing from 05/01/2015 which showed sinus rhythm with increased voltage and repolarization abnormalities.  Recent Labwork:  February 2017: BUN 14, creatinine 0.6, potassium 3.7, AST 13, ALT 11, troponin T negative 3, NT-pro BNP 109, hemoglobin 7.4-9.0, platelets 225  Other Studies Reviewed Today:  Cardiac catheterization 10/03/2014:  Mid LAD lesion, 60% stenosed.  Dist LAD lesion, 60% stenosed.  LIMA was injected is normal in caliber, and is anatomically normal.  There is competitive flow.  RIMA was injected is normal in caliber, and is anatomically normal.  SVG was injected is normal in caliber.  There is severe focal disease in the graft.  Origin lesion, 80% stenosed.  SVG was  injected .  There is severe disease in the graft.  Mid RCA lesion, 30% stenosed.  Dist RCA lesion, 80% stenosed.  Mid Graft to Dist Graft lesion, 100% stenosed. There is a 0% residual stenosis post intervention.  A drug-eluting stent was placed.  The left ventricular systolic function is normal.  Significant 3-vessel native coronary obstructive disease with tandem proximal to mid 60% LAD stenoses with a "flush and fill "phenomena seen in the mid LAD due to competitive LIMA filling; firm percent ostial marginal stenosis with segmental 9095% mid distal marginal branch stenoses of the left circumflex coronary artery; the present mid and 80% distal RCA stenosis proximal to the PDA takeoff with mild competitive filling in the mid PDA.  Patent LIMA graft supplying the mid LAD.  Y graft consisting of the superior limb being a free RIMA graft supplying the high diagonal/intermediate like vessel and the SVG inferior limb of the Y graft totally occluded in the mid distal segment which previously had supplied the OM 2 vessel.  SVG supplying the  PDA vessel with 80% focal proximal SVG stenosis.  Low-normal global LV function with an ejection fraction of 50% and subtle mid anterolateral hypocontractility.  Successful PCI to the SVG Y graft limb with total occlusion of the mid distal portion of the graft, treated successfully with PTCA/DES stenting with a Synergy 3.038 mm stent dilated to 3.15 mm in the 100% occlusion and TIMI 0 flow being improved to 0% stenosis with TIMI-3 flow.  Assessment and Plan:  1. Symptomatically stable multivessel CAD status post CABG with subsequent DES intervention to the SVG to OM1 and PDA as of 2016. He reports no angina or nitroglycerin use. Continue with medical therapy and long-term DAPT.  2. Essential hypertension, blood pressure elevated today. He reports compliance with his medications. We discussed weight loss, salt restriction, also getting back to his  regular walking plan.  3. Hyperlipidemia, on Lipitor. He follows routinely through the [facility] medical clinic in [place].  4. History of GI bleed, no recent recurrences.  Current medicines were reviewed with the patient today.   Orders Placed This Encounter  Procedures  . EKG 12-Lead    Disposition: Follow-up in one year, sooner if needed.  Signed, Jonelle Sidle, MD, [facility] 11/05/2016 12:05 PM    [facility] at [facility] [address] [place], [place], [place] [address] Phone: [phone]; Fax: [phone]

## 2016-11-05 ENCOUNTER — Ambulatory Visit (INDEPENDENT_AMBULATORY_CARE_PROVIDER_SITE_OTHER): Payer: BLUE CROSS/BLUE SHIELD | Admitting: Cardiology

## 2016-11-05 ENCOUNTER — Encounter: Payer: Self-pay | Admitting: Cardiology

## 2016-11-05 VITALS — BP 168/98 | HR 78 | Ht 75.0 in | Wt 301.0 lb

## 2016-11-05 DIAGNOSIS — Z8719 Personal history of other diseases of the digestive system: Secondary | ICD-10-CM | POA: Diagnosis not present

## 2016-11-05 DIAGNOSIS — E782 Mixed hyperlipidemia: Secondary | ICD-10-CM

## 2016-11-05 DIAGNOSIS — I10 Essential (primary) hypertension: Secondary | ICD-10-CM | POA: Diagnosis not present

## 2016-11-05 DIAGNOSIS — I2510 Atherosclerotic heart disease of native coronary artery without angina pectoris: Secondary | ICD-10-CM

## 2016-11-05 MED ORDER — NITROGLYCERIN 0.4 MG SL SUBL: 0.4000 mg | SUBLINGUAL_TABLET | SUBLINGUAL | 2 refills | Status: DC | PRN

## 2016-11-05 NOTE — Patient Instructions (Signed)

## 2016-11-15 IMAGING — CR DG CHEST 1V PORT
1 series · 1 of 1 positions shown · non-contrast
Comparison: Portable chest x-rays yesterday, 07/14/2014.

CLINICAL DATA: Two days postop CABG.  Followup basilar atelectasis.

EXAM:
PORTABLE CHEST - 1 VIEW

[AP]
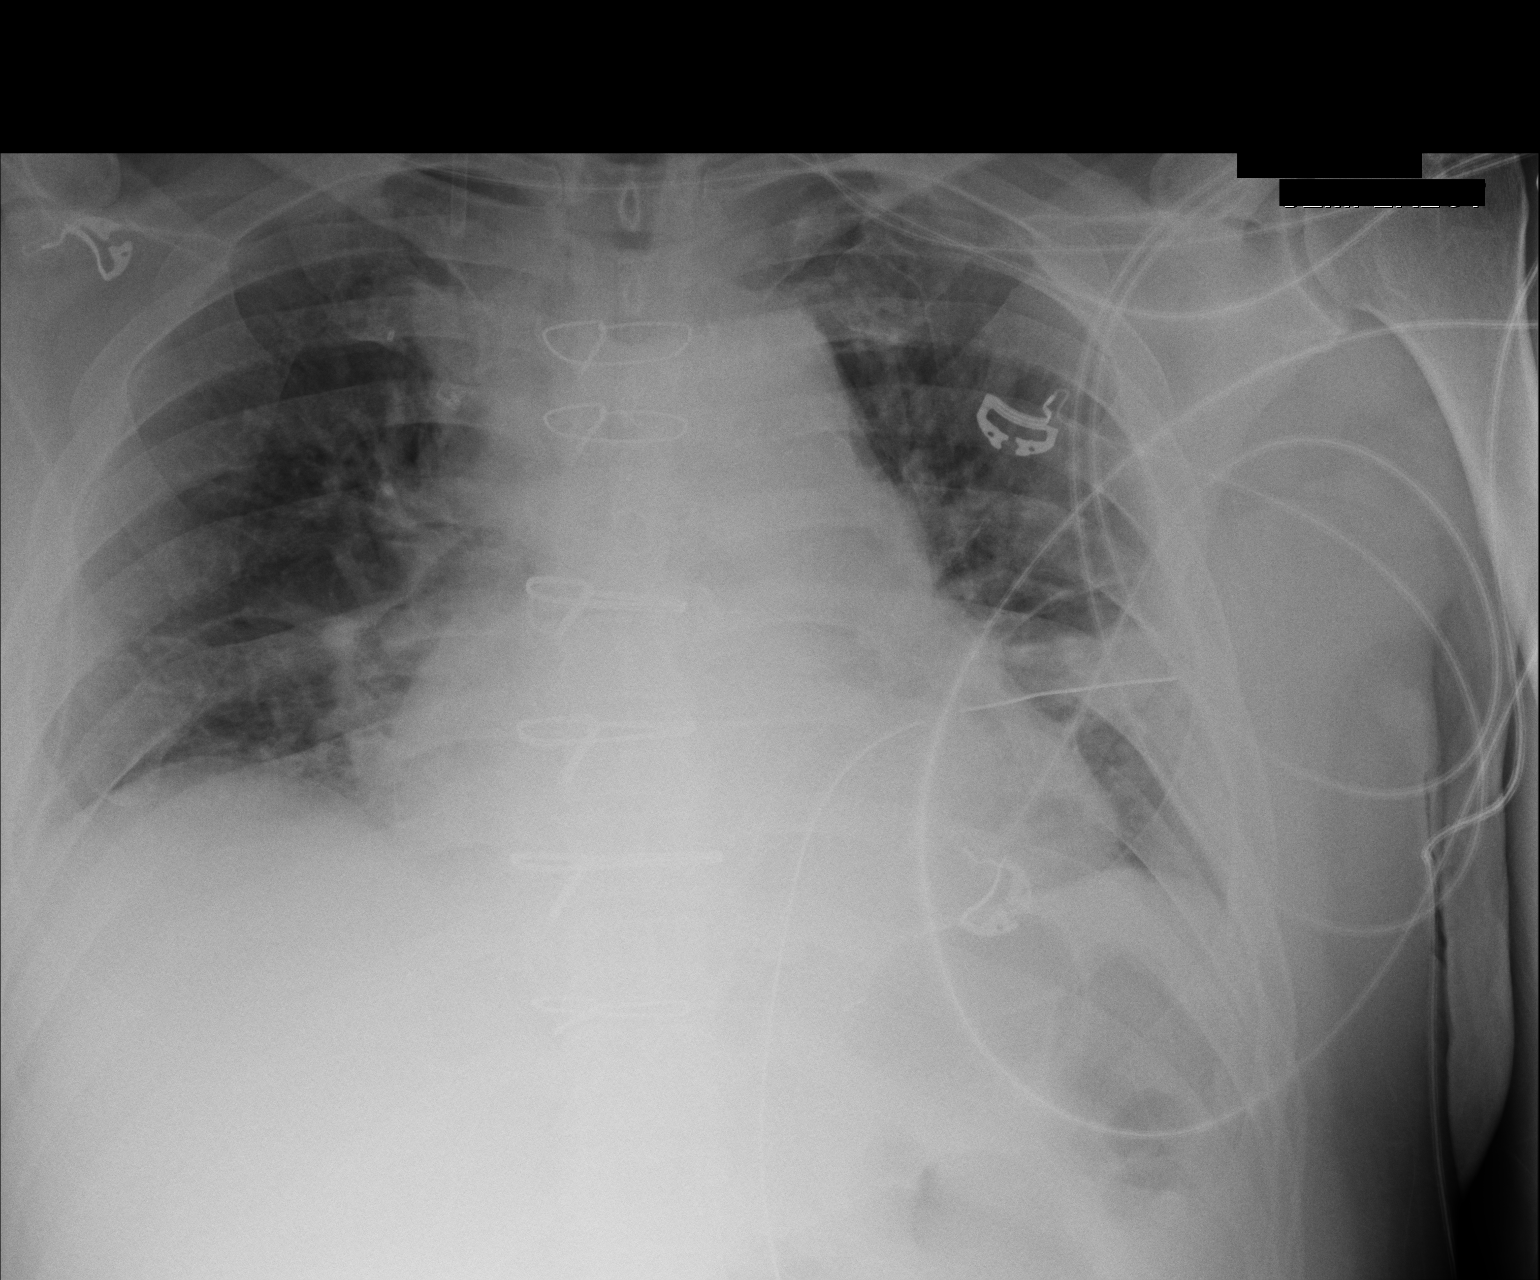

[1 of 1 positions shown; findings below may reference images not displayed]

FINDINGS: Interval Swan-Ganz catheter removal and right chest tube removal. No
right pneumothorax. Left chest tube remains in place with no
pneumothorax. Right jugular introducer sheath tip projects at the
junction of the jugular and innominate vein.

Sternotomy for CABG. Cardiac silhouette moderately enlarged,
unchanged. Mild pulmonary venous hypertension without overt edema.
Suboptimal inspiration will mild bibasilar atelectasis, unchanged.
No new pulmonary parenchymal abnormalities.
IMPRESSION: 1. No pneumothorax after right chest tube removal.
2. Support apparatus satisfactory.
3. Suboptimal inspiration accounts for mild bibasilar atelectasis,
stable. No new abnormalities.

## 2016-11-18 ENCOUNTER — Other Ambulatory Visit: Payer: Self-pay | Admitting: Cardiology

## 2016-11-18 DIAGNOSIS — I10 Essential (primary) hypertension: Secondary | ICD-10-CM

## 2017-01-02 ENCOUNTER — Other Ambulatory Visit: Payer: Self-pay | Admitting: Cardiology

## 2017-03-28 ENCOUNTER — Other Ambulatory Visit: Payer: Self-pay | Admitting: Cardiology

## 2017-04-20 ENCOUNTER — Other Ambulatory Visit: Payer: Self-pay | Admitting: Cardiology

## 2017-05-25 ENCOUNTER — Other Ambulatory Visit: Payer: Self-pay | Admitting: Cardiology

## 2017-05-25 DIAGNOSIS — I10 Essential (primary) hypertension: Secondary | ICD-10-CM

## 2017-09-22 ENCOUNTER — Other Ambulatory Visit: Payer: Self-pay | Admitting: Cardiology

## 2018-01-05 ENCOUNTER — Other Ambulatory Visit: Payer: Self-pay

## 2018-01-05 ENCOUNTER — Other Ambulatory Visit: Payer: Self-pay | Admitting: Cardiology

## 2018-01-05 DIAGNOSIS — I10 Essential (primary) hypertension: Secondary | ICD-10-CM

## 2018-01-05 MED ORDER — METOPROLOL TARTRATE 50 MG PO TABS: 50.0000 mg | ORAL_TABLET | Freq: Two times a day (BID) | ORAL | 0 refills | Status: DC

## 2018-01-05 MED ORDER — CLOPIDOGREL BISULFATE 75 MG PO TABS: 75.0000 mg | ORAL_TABLET | Freq: Every day | ORAL | 0 refills | Status: DC

## 2018-01-05 MED ORDER — ATORVASTATIN CALCIUM 80 MG PO TABS: ORAL_TABLET | ORAL | 0 refills | Status: DC

## 2018-01-05 MED ORDER — FUROSEMIDE 40 MG PO TABS: 40.0000 mg | ORAL_TABLET | Freq: Every day | ORAL | 0 refills | Status: DC

## 2018-01-05 MED ORDER — LISINOPRIL 20 MG PO TABS: 20.0000 mg | ORAL_TABLET | Freq: Every day | ORAL | 0 refills | Status: DC

## 2018-01-05 NOTE — Telephone Encounter (Signed)
Refills sent

## 2018-01-05 NOTE — Telephone Encounter (Signed)
Pt is needing refills on all of his meds sent to [facility], he's scheduled for next week to see Dr. Diona Browner

## 2018-01-14 NOTE — Progress Notes (Deleted)
Cardiology Office Note  Date: 01/14/2018   ID: HERIBERTO STMARTIN, DOB 1960/07/11, MRN [id number]  PCP: Wallace Cullens, MD  Primary Cardiologist: [name], MD   No chief complaint on file.   History of Present Illness: Benjamin King is a 57 y.o. male last seen in August 2018.  Past Medical History:  Diagnosis Date  . CAD (coronary artery disease)    Multivessel status post CABG April 2016, DES to SVG (Y graft) to OM and SVG to PDA/PLA July 2016  . Essential hypertension   . Hyperlipidemia   . NSTEMI (non-ST elevated myocardial infarction) [facility])    April 2016 and July 2016    Past Surgical History:  Procedure Laterality Date  . CARDIAC CATHETERIZATION N/A 10/03/2014   Procedure: Left Heart Cath and Coronary Angiography;  Surgeon: Lennette Bihari, MD;  Location: [facility] INVASIVE CV LAB;  Service: Cardiovascular;  Laterality: N/A;  . CARDIAC CATHETERIZATION  10/03/2014   Procedure: Coronary Stent Intervention;  Surgeon: Lennette Bihari, MD;  Location: [facility] INVASIVE CV LAB;  Service: Cardiovascular;;  . CARDIAC CATHETERIZATION N/A 10/05/2014   Procedure: Coronary Stent Intervention;  Surgeon: Lennette Bihari, MD;  Location: [facility] INVASIVE CV LAB;  Service: Cardiovascular;  Laterality: N/A;  . CORONARY ARTERY BYPASS GRAFT N/A 07/14/2014   Procedure: CORONARY ARTERY BYPASS GRAFTING (CABG), ON PUMP, TIMES FIVE, USING BILATERAL MAMMARY ARTERIES, RIGHT GREATER SAPHENOUS VEIN HARVESTED ENDOSCOPICALLY;  Surgeon: Loreli Slot, MD;  Location: [facility] OR;  Service: Open Heart Surgery;  Laterality: N/A;  Bilateral Mammary Arteries  . LEFT HEART CATHETERIZATION WITH CORONARY ANGIOGRAM N/A 07/10/2014   Procedure: LEFT HEART CATHETERIZATION WITH CORONARY ANGIOGRAM;  Surgeon: Corky Crafts, MD;  Location: [facility] CATH LAB;  Service: Cardiovascular;  Laterality: N/A;  . Right knee surgery     Trauma  . TEE WITHOUT CARDIOVERSION N/A 07/14/2014   Procedure: TRANSESOPHAGEAL ECHOCARDIOGRAM (TEE);  Surgeon:  Loreli Slot, MD;  Location: [facility] OR;  Service: Open Heart Surgery;  Laterality: N/A;    Current Outpatient Medications  Medication Sig Dispense Refill  . aspirin EC 81 MG tablet Take 81 mg by mouth daily.    Marland Kitchen atorvastatin (LIPITOR) 80 MG tablet TAKE 1 TABLET BY MOUTH ONCE DAILY AT  6  PM  NEEDS  APPT 90 tablet 0  . Cholecalciferol (VITAMIN D-3) 1000 UNITS CAPS Take 1 capsule by mouth daily.    . clopidogrel (PLAVIX) 75 MG tablet Take 1 tablet (75 mg total) by mouth daily. 90 tablet 0  . furosemide (LASIX) 40 MG tablet Take 1 tablet (40 mg total) by mouth daily. 90 tablet 0  . lisinopril (PRINIVIL,ZESTRIL) 20 MG tablet Take 1 tablet (20 mg total) by mouth daily. 90 tablet 0  . meloxicam (MOBIC) 15 MG tablet Take 15 mg by mouth daily.    . metoprolol tartrate (LOPRESSOR) 50 MG tablet Take 1 tablet (50 mg total) by mouth 2 (two) times daily. 60 tablet 0  . Multiple Vitamins-Minerals (MULTIVITAMIN ADULT PO) Take 1 tablet by mouth daily.    . nitroGLYCERIN (NITROSTAT) 0.4 MG SL tablet Place 1 tablet (0.4 mg total) under the tongue every 5 (five) minutes x 3 doses as needed for chest pain. 25 tablet 2  . omeprazole (PRILOSEC) 20 MG capsule Take 20 mg by mouth daily.     No current facility-administered medications for this visit.    Allergies:  Patient has no known allergies.   Social History: The patient  reports that he has  quit smoking. His smoking use included cigars. He has never used smokeless tobacco. He reports that he does not drink alcohol or use drugs.   Family History: The patient's family history includes Clotting disorder in his father; Coronary artery disease in his mother.   ROS:  Please see the history of present illness. Otherwise, complete review of systems is positive for {NONE DEFAULTED:18576::"none"}.  All other systems are reviewed and negative.   Physical Exam: VS:  There were no vitals taken for this visit., BMI There is no height or weight on file to calculate  BMI.  Wt Readings from Last 3 Encounters:  11/05/16 (!) 301 lb (136.5 kg)  09/11/15 296 lb (134.3 kg)  02/27/15 298 lb 12.8 oz (135.5 kg)    General: Patient appears comfortable at rest. HEENT: Conjunctiva and lids normal, oropharynx clear with moist mucosa. Neck: Supple, no elevated JVP or carotid bruits, no thyromegaly. Lungs: Clear to auscultation, nonlabored breathing at rest. Cardiac: Regular rate and rhythm, no S3 or significant systolic murmur, no pericardial rub. Abdomen: Soft, nontender, no hepatomegaly, bowel sounds present, no guarding or rebound. Extremities: No pitting edema, distal pulses 2+. Skin: Warm and dry. Musculoskeletal: No kyphosis. Neuropsychiatric: Alert and oriented x3, affect grossly appropriate.  ECG: I personally reviewed the tracing from 11/05/2016 which showed sinus rhythm with LVH and repolarization abnormalities, left atrial enlargement.  Recent Labwork:  February 2017: BUN 14, creatinine 0.6, potassium 3.7, AST 13, ALT 11, troponin T negative 3, NT-pro BNP 109, hemoglobin 7.4-9.0, platelets 225  Other Studies Reviewed Today:  Cardiac catheterization 10/03/2014:  Mid LAD lesion, 60% stenosed.  Dist LAD lesion, 60% stenosed.  LIMA was injected is normal in caliber, and is anatomically normal.  There is competitive flow.  RIMA was injected is normal in caliber, and is anatomically normal.  SVG was injected is normal in caliber.  There is severe focal disease in the graft.  Origin lesion, 80% stenosed.  SVG was injected .  There is severe disease in the graft.  Mid RCA lesion, 30% stenosed.  Dist RCA lesion, 80% stenosed.  Mid Graft to Dist Graft lesion, 100% stenosed. There is a 0% residual stenosis post intervention.  A drug-eluting stent was placed.  The left ventricular systolic function is normal.  Significant 3-vessel native coronary obstructive disease with tandem proximal to mid 60% LAD stenoses with a "flush and fill  "phenomena seen in the mid LAD due to competitive LIMA filling; firm percent ostial marginal stenosis with segmental 9095% mid distal marginal branch stenoses of the left circumflex coronary artery; the present mid and 80% distal RCA stenosis proximal to the PDA takeoff with mild competitive filling in the mid PDA.  Patent LIMA graft supplying the mid LAD.  Y graft consisting of the superior limb being a free RIMA graft supplying the high diagonal/intermediate like vessel and the SVG inferior limb of the Y graft totally occluded in the mid distal segment which previously had supplied the OM 2 vessel.  SVG supplying the PDA vessel with 80% focal proximal SVG stenosis.  Low-normal global LV function with an ejection fraction of 50% and subtle mid anterolateral hypocontractility.  Successful PCI to the SVG Y graft limb with total occlusion of the mid distal portion of the graft, treated successfully with PTCA/DES stenting with a Synergy 3.038 mm stent dilated to 3.15 mm in the 100% occlusion and TIMI 0 flow being improved to 0% stenosis with TIMI-3 flow.  Assessment and Plan:   Current medicines were  reviewed with the patient today.  No orders of the defined types were placed in this encounter.   Disposition:  Signed, Jonelle Sidle, MD, [facility] 01/14/2018 1:54 PM    [facility] [address] [place], [place] [address] Phone: [phone]; Fax: [phone]

## 2018-01-15 ENCOUNTER — Ambulatory Visit: Payer: BLUE CROSS/BLUE SHIELD | Admitting: Cardiology

## 2018-01-20 NOTE — Progress Notes (Signed)
Cardiology Office Note  Date: 01/21/2018   ID: Benjamin King, DOB [birth year], MRN [id number]  PCP: Wallace Cullens, MD  Primary Cardiologist: [name], MD   Chief Complaint  Patient presents with  . Coronary Artery Disease    History of Present Illness: Benjamin King is a 57 y.o. male last seen in August 2018.  He presents overdue for follow-up.  He tells me that he has been feeling well, no angina symptoms or nitroglycerin use.  He has been trying to lose some weight through diet, not exercising as much, but we did talk about a walking plan.  I went over his medications which are outlined below.  He continues on long-term dual antiplatelet therapy, does not indicate any bleeding problems.  We discussed getting a refill for a fresh bottle of nitroglycerin.  He continues to follow with PCP and plans on follow-up lab work later this year.  I personally reviewed his ECG today which shows sinus rhythm with LVH and nonspecific ST-T changes.  Past Medical History:  Diagnosis Date  . CAD (coronary artery disease)    Multivessel status post CABG April 2016, DES to SVG (Y graft) to OM and SVG to PDA/PLA July 2016  . Essential hypertension   . Hyperlipidemia   . NSTEMI (non-ST elevated myocardial infarction) [facility])    April 2016 and July 2016    Past Surgical History:  Procedure Laterality Date  . CARDIAC CATHETERIZATION N/A 10/03/2014   Procedure: Left Heart Cath and Coronary Angiography;  Surgeon: Lennette Bihari, MD;  Location: [facility] INVASIVE CV LAB;  Service: Cardiovascular;  Laterality: N/A;  . CARDIAC CATHETERIZATION  10/03/2014   Procedure: Coronary Stent Intervention;  Surgeon: Lennette Bihari, MD;  Location: [facility] INVASIVE CV LAB;  Service: Cardiovascular;;  . CARDIAC CATHETERIZATION N/A 10/05/2014   Procedure: Coronary Stent Intervention;  Surgeon: Lennette Bihari, MD;  Location: [facility] INVASIVE CV LAB;  Service: Cardiovascular;  Laterality: N/A;  . CORONARY ARTERY BYPASS GRAFT N/A  07/14/2014   Procedure: CORONARY ARTERY BYPASS GRAFTING (CABG), ON PUMP, TIMES FIVE, USING BILATERAL MAMMARY ARTERIES, RIGHT GREATER SAPHENOUS VEIN HARVESTED ENDOSCOPICALLY;  Surgeon: Loreli Slot, MD;  Location: [facility] OR;  Service: Open Heart Surgery;  Laterality: N/A;  Bilateral Mammary Arteries  . LEFT HEART CATHETERIZATION WITH CORONARY ANGIOGRAM N/A 07/10/2014   Procedure: LEFT HEART CATHETERIZATION WITH CORONARY ANGIOGRAM;  Surgeon: Corky Crafts, MD;  Location: [facility] CATH LAB;  Service: Cardiovascular;  Laterality: N/A;  . Right knee surgery     Trauma  . TEE WITHOUT CARDIOVERSION N/A 07/14/2014   Procedure: TRANSESOPHAGEAL ECHOCARDIOGRAM (TEE);  Surgeon: Loreli Slot, MD;  Location: [facility] OR;  Service: Open Heart Surgery;  Laterality: N/A;    Current Outpatient Medications  Medication Sig Dispense Refill  . aspirin EC 81 MG tablet Take 81 mg by mouth daily.    Marland Kitchen atorvastatin (LIPITOR) 80 MG tablet TAKE 1 TABLET BY MOUTH ONCE DAILY AT  6  PM  NEEDS  APPT 90 tablet 0  . Cholecalciferol (VITAMIN D-3) 1000 UNITS CAPS Take 1 capsule by mouth daily.    . clopidogrel (PLAVIX) 75 MG tablet Take 1 tablet (75 mg total) by mouth daily. 90 tablet 0  . furosemide (LASIX) 40 MG tablet Take 1 tablet (40 mg total) by mouth daily. 90 tablet 0  . lisinopril (PRINIVIL,ZESTRIL) 20 MG tablet Take 1 tablet (20 mg total) by mouth daily. 90 tablet 0  . meloxicam (MOBIC) 15 MG tablet Take 15  mg by mouth daily.    . metoprolol tartrate (LOPRESSOR) 50 MG tablet Take 1 tablet (50 mg total) by mouth 2 (two) times daily. 60 tablet 0  . Multiple Vitamins-Minerals (MULTIVITAMIN ADULT PO) Take 1 tablet by mouth daily.    . nitroGLYCERIN (NITROSTAT) 0.4 MG SL tablet Place 1 tablet (0.4 mg total) under the tongue every 5 (five) minutes x 3 doses as needed for chest pain. 25 tablet 3  . omeprazole (PRILOSEC) 20 MG capsule Take 20 mg by mouth daily.     No current facility-administered medications for this  visit.    Allergies:  Patient has no known allergies.   Social History: The patient  reports that he has quit smoking. His smoking use included cigars. He has never used smokeless tobacco. He reports that he does not drink alcohol or use drugs.   ROS:  Please see the history of present illness. Otherwise, complete review of systems is positive for none.  All other systems are reviewed and negative.   Physical Exam: VS:  BP (!) 149/94   Pulse 73   Ht 6\' 3"  (1.905 m)   Wt 268 lb 12.8 oz (121.9 kg)   SpO2 96%   BMI 33.60 kg/m , BMI Body mass index is 33.6 kg/m.  Wt Readings from Last 3 Encounters:  01/21/18 268 lb 12.8 oz (121.9 kg)  11/05/16 (!) 301 lb (136.5 kg)  09/11/15 296 lb (134.3 kg)    General: Patient appears comfortable at rest. HEENT: Conjunctiva and lids normal, oropharynx clear. Neck: Supple, no elevated JVP or carotid bruits, no thyromegaly. Lungs: Clear to auscultation, nonlabored breathing at rest. Cardiac: Regular rate and rhythm, no S3 or significant systolic murmur. Abdomen: Soft, nontender, bowel sounds present. Extremities: No pitting edema, distal pulses 2+. Skin: Warm and dry. Musculoskeletal: No kyphosis. Neuropsychiatric: Alert and oriented x3, affect grossly appropriate.  ECG: I personally reviewed the tracing from 11/05/2016 which showed sinus rhythm with LVH and repolarization changes, left atrial enlargement.  Recent Labwork:    Component Value Date/Time   CHOL 138 10/04/2014 0408   TRIG 73 10/04/2014 0408   HDL 33 (L) 10/04/2014 0408   CHOLHDL 4.2 10/04/2014 0408   VLDL 15 10/04/2014 0408   LDLCALC 90 10/04/2014 0408  February 2017: BUN 14, creatinine 0.63, potassium 3.7, AST 13, ALT 11, hemoglobin 9.0, platelets 225  Other Studies Reviewed Today:  Cardiac catheterization 10/03/2014:  Mid LAD lesion, 60% stenosed.  Dist LAD lesion, 60% stenosed.  LIMA was injected is normal in caliber, and is anatomically normal.  There is competitive  flow.  RIMA was injected is normal in caliber, and is anatomically normal.  SVG was injected is normal in caliber.  There is severe focal disease in the graft.  Origin lesion, 80% stenosed.  SVG was injected .  There is severe disease in the graft.  Mid RCA lesion, 30% stenosed.  Dist RCA lesion, 80% stenosed.  Mid Graft to Dist Graft lesion, 100% stenosed. There is a 0% residual stenosis post intervention.  A drug-eluting stent was placed.  The left ventricular systolic function is normal.  Significant 3-vessel native coronary obstructive disease with tandem proximal to mid 60% LAD stenoses with a "flush and fill "phenomena seen in the mid LAD due to competitive LIMA filling; firm percent ostial marginal stenosis with segmental 9095% mid distal marginal branch stenoses of the left circumflex coronary artery; the present mid and 80% distal RCA stenosis proximal to the PDA takeoff with mild competitive  filling in the mid PDA.  Patent LIMA graft supplying the mid LAD.  Y graft consisting of the superior limb being a free RIMA graft supplying the high diagonal/intermediate like vessel and the SVG inferior limb of the Y graft totally occluded in the mid distal segment which previously had supplied the OM 2 vessel.  SVG supplying the PDA vessel with 80% focal proximal SVG stenosis.  Low-normal global LV function with an ejection fraction of 50% and subtle mid anterolateral hypocontractility.  Successful PCI to the SVG Y graft limb with total occlusion of the mid distal portion of the graft, treated successfully with PTCA/DES stenting with a Synergy 3.038 mm stent dilated to 3.15 mm in the 100% occlusion and TIMI 0 flow being improved to 0% stenosis with TIMI-3 flow.  Assessment and Plan:  1.  CAD status post CABG in 2016 with subsequent DES to the SVG to OM1 and PDA also in 2016.  Plan is to continue long-term dual antiplatelet therapy, he is tolerating this without  spontaneous bleeding problems.  He reports no active angina.  ECG reviewed and stable.  Refill provided for fresh bottle of nitroglycerin.  2.  Mixed hyperlipidemia, he continues on Lipitor.  Plans to follow-up with PCP for repeat lab work at physical later this year.  3.  Essential hypertension, no changes made to current regimen.  Blood pressure is mildly elevated today.  We discussed weight loss and exercise.  Keep follow-up with PCP.  Current medicines were reviewed with the patient today.   Orders Placed This Encounter  Procedures  . EKG 12-Lead    Disposition: Follow-up in 1 year, sooner if needed.  Signed, Jonelle Sidle, MD, [facility] 01/21/2018 1:19 PM    [facility] at [facility] [address] [place], [place], [place] [address] Phone: [phone]; Fax: [phone]

## 2018-01-21 ENCOUNTER — Ambulatory Visit (INDEPENDENT_AMBULATORY_CARE_PROVIDER_SITE_OTHER): Payer: Medicare Other | Admitting: Cardiology

## 2018-01-21 ENCOUNTER — Encounter: Payer: Self-pay | Admitting: Cardiology

## 2018-01-21 VITALS — BP 149/94 | HR 73 | Ht 75.0 in | Wt 268.8 lb

## 2018-01-21 DIAGNOSIS — I10 Essential (primary) hypertension: Secondary | ICD-10-CM

## 2018-01-21 DIAGNOSIS — E782 Mixed hyperlipidemia: Secondary | ICD-10-CM

## 2018-01-21 DIAGNOSIS — I25119 Atherosclerotic heart disease of native coronary artery with unspecified angina pectoris: Secondary | ICD-10-CM | POA: Diagnosis not present

## 2018-01-21 MED ORDER — NITROGLYCERIN 0.4 MG SL SUBL: 0.4000 mg | SUBLINGUAL_TABLET | SUBLINGUAL | 3 refills | Status: DC | PRN

## 2018-01-21 NOTE — Patient Instructions (Signed)

## 2018-02-10 ENCOUNTER — Other Ambulatory Visit: Payer: Self-pay | Admitting: Cardiology

## 2018-04-07 ENCOUNTER — Other Ambulatory Visit: Payer: Self-pay | Admitting: Cardiology

## 2018-04-14 ENCOUNTER — Other Ambulatory Visit: Payer: Self-pay | Admitting: Cardiology

## 2018-04-14 DIAGNOSIS — I10 Essential (primary) hypertension: Secondary | ICD-10-CM

## 2018-07-22 ENCOUNTER — Other Ambulatory Visit: Payer: Self-pay | Admitting: Cardiology

## 2018-08-08 ENCOUNTER — Other Ambulatory Visit: Payer: Self-pay | Admitting: Cardiology

## 2019-01-14 ENCOUNTER — Telehealth: Payer: Self-pay | Admitting: Cardiology

## 2019-01-14 NOTE — Telephone Encounter (Signed)
Virtual Visit Pre-Appointment Phone Call  "(Name), I am calling you today to discuss your upcoming appointment. We are currently trying to limit exposure to the virus that causes COVID-19 by seeing patients at home rather than in the office."  1. "What is the BEST phone number to call the day of the visit?" - include this in appointment notes  2. Do you have or have access to (through a family member/friend) a smartphone with video capability that we can use for your visit?" a. If yes - list this number in appt notes as cell (if different from BEST phone #) and list the appointment type as a VIDEO visit in appointment notes b. If no - list the appointment type as a PHONE visit in appointment notes  3. Confirm consent - "In the setting of the current Covid19 crisis, you are scheduled for a (phone or video) visit with your provider on (date) at (time).  Just as we do with many in-office visits, in order for you to participate in this visit, we must obtain consent.  If you'd like, I can send this to your mychart (if signed up) or email for you to review.  Otherwise, I can obtain your verbal consent now.  All virtual visits are billed to your insurance company just like a normal visit would be.  By agreeing to a virtual visit, we'd like you to understand that the technology does not allow for your provider to perform an examination, and thus may limit your provider's ability to fully assess your condition. If your provider identifies any concerns that need to be evaluated in person, we will make arrangements to do so.  Finally, though the technology is pretty good, we cannot assure that it will always work on either your or our end, and in the setting of a video visit, we may have to convert it to a phone-only visit.  In either situation, we cannot ensure that we have a secure connection.  Are you willing to proceed?" STAFF: Did the patient verbally acknowledge consent to telehealth visit? Document  YES/NO here: yes  4. Advise patient to be prepared - "Two hours prior to your appointment, go ahead and check your blood pressure, pulse, oxygen saturation, and your weight (if you have the equipment to check those) and write them all down. When your visit starts, your provider will ask you for this information. If you have an Apple Watch or Kardia device, please plan to have heart rate information ready on the day of your appointment. Please have a pen and paper handy nearby the day of the visit as well."  5. Give patient instructions for MyChart download to smartphone OR Doximity/Doxy.me as below if video visit (depending on what platform provider is using)  6. Inform patient they will receive a phone call 15 minutes prior to their appointment time (may be from unknown caller ID) so they should be prepared to answer    TELEPHONE CALL NOTE  Benjamin King has been deemed a candidate for a follow-up tele-health visit to limit community exposure during the Covid-19 pandemic. I spoke with the patient via phone to ensure availability of phone/video source, confirm preferred email & phone number, and discuss instructions and expectations.  I reminded Benjamin King to be prepared with any vital sign and/or heart rhythm information that could potentially be obtained via home monitoring, at the time of his visit. I reminded Benjamin King to expect a phone call prior to  his visit.  [name] 01/14/2019 2:50 PM   INSTRUCTIONS FOR DOWNLOADING THE MYCHART APP TO SMARTPHONE  - The patient must first make sure to have activated MyChart and know their login information - If Apple, go to [employer] and type in MyChart in the search bar and download the app. If Android, ask patient to go to [employer] and type in [place] in the search bar and download the app. The app is free but as with any other app downloads, their phone may require them to verify saved payment information or Apple/Android  password.  - The patient will need to then log into the app with their MyChart username and password, and select [facility] as their healthcare provider to link the account. When it is time for your visit, go to the MyChart app, find appointments, and click Begin Video Visit. Be sure to Select Allow for your device to access the Microphone and Camera for your visit. You will then be connected, and your provider will be with you shortly.  **If they have any issues connecting, or need assistance please contact MyChart service desk [phone])**  **If using a computer, in order to ensure the best quality for their visit they will need to use either of the following Internet Browsers: [employer] or Google Chrome**  IF USING DOXIMITY or DOXY.ME - The patient will receive a link just prior to their visit by text.     FULL LENGTH CONSENT FOR TELE-HEALTH VISIT   I hereby voluntarily request, consent and authorize [facility] and its employed or contracted physicians, physician assistants, nurse practitioners or other licensed health care professionals (the Practitioner), to provide me with telemedicine health care services (the Services") as deemed necessary by the treating Practitioner. I acknowledge and consent to receive the Services by the Practitioner via telemedicine. I understand that the telemedicine visit will involve communicating with the Practitioner through live audiovisual communication technology and the disclosure of certain medical information by electronic transmission. I acknowledge that I have been given the opportunity to request an in-person assessment or other available alternative prior to the telemedicine visit and am voluntarily participating in the telemedicine visit.  I understand that I have the right to withhold or withdraw my consent to the use of telemedicine in the course of my care at any time, without affecting my right to future care or treatment,  and that the Practitioner or I may terminate the telemedicine visit at any time. I understand that I have the right to inspect all information obtained and/or recorded in the course of the telemedicine visit and may receive copies of available information for a reasonable fee.  I understand that some of the potential risks of receiving the Services via telemedicine include:   Delay or interruption in medical evaluation due to technological equipment failure or disruption;  Information transmitted may not be sufficient (e.g. poor resolution of images) to allow for appropriate medical decision making by the Practitioner; and/or   In rare instances, security protocols could fail, causing a breach of personal health information.  Furthermore, I acknowledge that it is my responsibility to provide information about my medical history, conditions and care that is complete and accurate to the best of my ability. I acknowledge that Practitioner's advice, recommendations, and/or decision may be based on factors not within their control, such as incomplete or inaccurate data provided by me or distortions of diagnostic images or specimens that may result from electronic transmissions. I  understand that the practice of medicine is not an exact science and that Practitioner makes no warranties or guarantees regarding treatment outcomes. I acknowledge that I will receive a copy of this consent concurrently upon execution via email to the email address I last provided but may also request a printed copy by calling the office of [place].    I understand that my insurance will be billed for this visit.   I have read or had this consent read to me.  I understand the contents of this consent, which adequately explains the benefits and risks of the Services being provided via telemedicine.   I have been provided ample opportunity to ask questions regarding this consent and the Services and have had my questions  answered to my satisfaction.  I give my informed consent for the services to be provided through the use of telemedicine in my medical care  By participating in this telemedicine visit I agree to the above.

## 2019-01-23 NOTE — Progress Notes (Signed)
Virtual Visit via Video Note   This visit type was conducted due to national recommendations for restrictions regarding the COVID-19 Pandemic (e.g. social distancing) in an effort to limit this patient's exposure and mitigate transmission in our community.  Due to his co-morbid illnesses, this patient is at least at moderate risk for complications without adequate follow up.  This format is felt to be most appropriate for this patient at this time.  All issues noted in this document were discussed and addressed.  A limited physical exam was performed with this format.  Please refer to the patient's chart for his consent to telehealth for [facility].   Date:  01/24/2019   ID:  Benjamin King, DOB [id number], MRN [id number]  Patient Location: Home Provider Location: Office  PCP:  Wallace Cullens, MD  Cardiologist:  [name], MD Electrophysiologist:  None   Evaluation Performed:  Follow-Up Visit  Chief Complaint:   Cardiac follow-up  History of Present Illness:    Benjamin King is a 58 y.o. male last seen in October 2019.  We communicated by video conferencing today.  He does not report any major health change in the last year.  No angina symptoms or nitroglycerin use.  He has been staying home during the pandemic, wears a mask when he goes out.  He has not been exercising, we talked about a walking plan today.  He is also due to follow-up with his PCP for lab work.  Reviewed his medications which are outlined below.  Cardiac regimen includes aspirin, Lipitor, Plavix, Zestril, Lopressor, and as needed nitroglycerin.  He does not report any intolerance on high-dose Lipitor.  The patient does not have symptoms concerning for COVID-19 infection (fever, chills, cough, or new shortness of breath).    Past Medical History:  Diagnosis Date  . CAD (coronary artery disease)    Multivessel status post CABG April 2016, DES to SVG (Y graft) to OM and SVG to PDA/PLA July 2016  . Essential  hypertension   . Hyperlipidemia   . NSTEMI (non-ST elevated myocardial infarction) [facility])    April 2016 and July 2016   Past Surgical History:  Procedure Laterality Date  . CARDIAC CATHETERIZATION N/A 10/03/2014   Procedure: Left Heart Cath and Coronary Angiography;  Surgeon: Lennette Bihari, MD;  Location: [facility] INVASIVE CV LAB;  Service: Cardiovascular;  Laterality: N/A;  . CARDIAC CATHETERIZATION  10/03/2014   Procedure: Coronary Stent Intervention;  Surgeon: Lennette Bihari, MD;  Location: [facility] INVASIVE CV LAB;  Service: Cardiovascular;;  . CARDIAC CATHETERIZATION N/A 10/05/2014   Procedure: Coronary Stent Intervention;  Surgeon: Lennette Bihari, MD;  Location: [facility] INVASIVE CV LAB;  Service: Cardiovascular;  Laterality: N/A;  . CORONARY ARTERY BYPASS GRAFT N/A 07/14/2014   Procedure: CORONARY ARTERY BYPASS GRAFTING (CABG), ON PUMP, TIMES FIVE, USING BILATERAL MAMMARY ARTERIES, RIGHT GREATER SAPHENOUS VEIN HARVESTED ENDOSCOPICALLY;  Surgeon: Loreli Slot, MD;  Location: [facility] OR;  Service: Open Heart Surgery;  Laterality: N/A;  Bilateral Mammary Arteries  . LEFT HEART CATHETERIZATION WITH CORONARY ANGIOGRAM N/A 07/10/2014   Procedure: LEFT HEART CATHETERIZATION WITH CORONARY ANGIOGRAM;  Surgeon: Corky Crafts, MD;  Location: [facility] CATH LAB;  Service: Cardiovascular;  Laterality: N/A;  . Right knee surgery     Trauma  . TEE WITHOUT CARDIOVERSION N/A 07/14/2014   Procedure: TRANSESOPHAGEAL ECHOCARDIOGRAM (TEE);  Surgeon: Loreli Slot, MD;  Location: [facility] OR;  Service: Open Heart Surgery;  Laterality: N/A;     Current Meds  Medication Sig  . aspirin EC 81 MG tablet Take 81 mg by mouth daily.  Marland Kitchen atorvastatin (LIPITOR) 80 MG tablet TAKE 1 TABLET BY MOUTH ONCE DAILY AT  6  PM  NEEDS  APPT  FOR  MORE  REFILLS  . azithromycin (ZITHROMAX) 250 MG tablet Take 2 tablets by mouth daily.  . chlorpheniramine-HYDROcodone (TUSSIONEX) 10-8 MG/5ML SUER Take 5 mLs by mouth 2 (two) times daily as needed.  .  Cholecalciferol (VITAMIN D-3) 1000 UNITS CAPS Take 1 capsule by mouth daily.  . clopidogrel (PLAVIX) 75 MG tablet TAKE 1 TABLET BY MOUTH ONCE DAILY  . furosemide (LASIX) 40 MG tablet TAKE 1 TABLET BY MOUTH ONCE DAILY  . lisinopril (ZESTRIL) 20 MG tablet Take 1 tablet by mouth once daily  . metoprolol tartrate (LOPRESSOR) 50 MG tablet TAKE 1 TABLET BY MOUTH TWICE DAILY  . Multiple Vitamins-Minerals (MULTIVITAMIN ADULT PO) Take 1 tablet by mouth daily.  . nitroGLYCERIN (NITROSTAT) 0.4 MG SL tablet Place 1 tablet (0.4 mg total) under the tongue every 5 (five) minutes x 3 doses as needed for chest pain.     Allergies:   Patient has no known allergies.   Social History   Tobacco Use  . Smoking status: Former Smoker    Types: Cigars  . Smokeless tobacco: Never Used  Substance Use Topics  . Alcohol use: No    Alcohol/week: 0.0 standard drinks  . Drug use: No     Family Hx: The patient's family history includes Clotting disorder in his father; Coronary artery disease in his mother.  ROS:   Please see the history of present illness. Recent "cold" symptoms with intermittent cough, no fevers or chills.  Using cough suppressant and reportedly antibiotics. All other systems reviewed and are negative.   Prior CV studies:   The following studies were reviewed today:  Cardiac catheterization 10/03/2014:  Mid LAD lesion, 60% stenosed.  Dist LAD lesion, 60% stenosed.  LIMA was injected is normal in caliber, and is anatomically normal.  There is competitive flow.  RIMA was injected is normal in caliber, and is anatomically normal.  SVG was injected is normal in caliber.  There is severe focal disease in the graft.  Origin lesion, 80% stenosed.  SVG was injected .  There is severe disease in the graft.  Mid RCA lesion, 30% stenosed.  Dist RCA lesion, 80% stenosed.  Mid Graft to Dist Graft lesion, 100% stenosed. There is a 0% residual stenosis post intervention.  A  drug-eluting stent was placed.  The left ventricular systolic function is normal.  Significant 3-vessel native coronary obstructive disease with tandem proximal to mid 60% LAD stenoses with a "flush and fill "phenomena seen in the mid LAD due to competitive LIMA filling; firm percent ostial marginal stenosis with segmental 9095% mid distal marginal branch stenoses of the left circumflex coronary artery; the present mid and 80% distal RCA stenosis proximal to the PDA takeoff with mild competitive filling in the mid PDA.  Patent LIMA graft supplying the mid LAD.  Y graft consisting of the superior limb being a free RIMA graft supplying the high diagonal/intermediate like vessel and the SVG inferior limb of the Y graft totally occluded in the mid distal segment which previously had supplied the OM 2 vessel.  SVG supplying the PDA vessel with 80% focal proximal SVG stenosis.  Low-normal global LV function with an ejection fraction of 50% and subtle mid anterolateral hypocontractility.  Successful PCI to the SVG Y graft limb  with total occlusion of the mid distal portion of the graft, treated successfully with PTCA/DES stenting with a Synergy 3.038 mm stent dilated to 3.15 mm in the 100% occlusion and TIMI 0 flow being improved to 0% stenosis with TIMI-3 flow.  Labs/Other Tests and Data Reviewed:     EKG:  An ECG dated 01/21/2018 was personally reviewed today and demonstrated:  Sinus rhythm with LVH and nonspecific ST-T changes.  Recent Labs:  No interval lab work for review today.  Wt Readings from Last 3 Encounters:  01/24/19 271 lb (122.9 kg)  01/21/18 268 lb 12.8 oz (121.9 kg)  11/05/16 (!) 301 lb (136.5 kg)     Objective:    Vital Signs:  BP (!) 152/82   Ht 6\' 3"  (1.905 m)   Wt 271 lb (122.9 kg)   BMI 33.87 kg/m    General: Patient appeared comfortable seated in his home. HEENT: Conjunctiva and lids normal. Skin: Normal appearance of color. Lungs: Patient spoke in  full sentences, no audible wheezing. Musculoskeletal: No kyphosis. Neuropsychiatric: Gaze conjugate.  Speech pattern normal.  Affect appropriate.  ASSESSMENT & PLAN:    1.  Multivessel CAD status post CABG in 2016 with subsequent PCI including DES to the SVG to OM1 and PDA.  He does not report any active angina at this time.  I talked with him about a walking regimen for exercise.  We went over his current cardiac regimen, no changes anticipated with plan for long-term dual antiplatelet therapy.  2.  Mixed hyperlipidemia, on high-dose Lipitor.  I recommended that he follow-up with his PCP for physical and lab work this year.  LDL should be under 70.  3.  Essential hypertension, systolic is in the 150s today.  He remains on Zestril and Lopressor.  Exercise would be beneficial.  I recommended that he see his PCP in case further medication titration is needed.  COVID-19 Education: The signs and symptoms of COVID-19 were discussed with the patient and how to seek care for testing (follow up with PCP or arrange E-visit).  The importance of social distancing was discussed today.  Time:   Today, I have spent 6 minutes with the patient with telehealth technology discussing the above problems.     Medication Adjustments/Labs and Tests Ordered: Current medicines are reviewed at length with the patient today.  Concerns regarding medicines are outlined above.   Tests Ordered: No orders of the defined types were placed in this encounter.   Medication Changes: No orders of the defined types were placed in this encounter.   Follow Up:  In [place] 1 [place].  Signed, Nona DellSamuel , MD  01/24/2019 9:25 AM    [facility]

## 2019-01-24 ENCOUNTER — Telehealth (INDEPENDENT_AMBULATORY_CARE_PROVIDER_SITE_OTHER): Payer: Medicare Other | Admitting: Cardiology

## 2019-01-24 ENCOUNTER — Encounter: Payer: Self-pay | Admitting: Cardiology

## 2019-01-24 VITALS — BP 152/82 | Ht 75.0 in | Wt 271.0 lb

## 2019-01-24 DIAGNOSIS — I10 Essential (primary) hypertension: Secondary | ICD-10-CM

## 2019-01-24 DIAGNOSIS — E782 Mixed hyperlipidemia: Secondary | ICD-10-CM | POA: Diagnosis not present

## 2019-01-24 DIAGNOSIS — I25119 Atherosclerotic heart disease of native coronary artery with unspecified angina pectoris: Secondary | ICD-10-CM | POA: Diagnosis not present

## 2019-01-24 NOTE — Patient Instructions (Addendum)

## 2019-01-30 ENCOUNTER — Other Ambulatory Visit: Payer: Self-pay | Admitting: Cardiology

## 2019-01-30 DIAGNOSIS — I10 Essential (primary) hypertension: Secondary | ICD-10-CM

## 2019-03-29 ENCOUNTER — Other Ambulatory Visit: Payer: Self-pay | Admitting: Cardiology

## 2019-08-15 ENCOUNTER — Other Ambulatory Visit: Payer: Self-pay | Admitting: *Deleted

## 2019-08-15 MED ORDER — LISINOPRIL 20 MG PO TABS: 20.0000 mg | ORAL_TABLET | Freq: Every day | ORAL | 1 refills | Status: DC

## 2019-11-10 ENCOUNTER — Other Ambulatory Visit: Payer: Self-pay | Admitting: Cardiology

## 2019-11-14 ENCOUNTER — Other Ambulatory Visit: Payer: Self-pay | Admitting: Cardiology

## 2019-11-16 ENCOUNTER — Other Ambulatory Visit: Payer: Self-pay | Admitting: Cardiology

## 2019-11-18 ENCOUNTER — Other Ambulatory Visit: Payer: Self-pay | Admitting: *Deleted

## 2019-11-18 MED ORDER — CLOPIDOGREL BISULFATE 75 MG PO TABS: 75.0000 mg | ORAL_TABLET | Freq: Every day | ORAL | 0 refills | Status: DC

## 2019-11-19 ENCOUNTER — Other Ambulatory Visit: Payer: Self-pay | Admitting: Cardiology

## 2019-11-19 DIAGNOSIS — I10 Essential (primary) hypertension: Secondary | ICD-10-CM

## 2020-02-21 ENCOUNTER — Other Ambulatory Visit: Payer: Self-pay | Admitting: Cardiology

## 2020-02-21 DIAGNOSIS — I10 Essential (primary) hypertension: Secondary | ICD-10-CM

## 2020-03-08 ENCOUNTER — Other Ambulatory Visit: Payer: Self-pay | Admitting: Cardiology

## 2020-03-26 NOTE — Progress Notes (Signed)
Cardiology Office Note  Date: 03/27/2020   ID: Benjamin King, DOB 10/26/1960, MRN [id number]  PCP:  Wallace Cullens, MD  Cardiologist:  [name], MD Electrophysiologist:  None   Chief Complaint: Cardiac follow up  History of Present Illness: Benjamin King is a 60 y.o. male with a history of CAD/NSTEMI, HTN, HLD.  History of multivessel CAD status post CABG 2016 with PCI including DES to SVG to OM1 and PDA.  Last encounter via telemedicine with Dr. Diona Browner 01/24/2019.  He voiced no recent anginal symptoms or nitroglycerin use.  He had not been exercising.  He was continuing his cardiac regimen including aspirin, Lipitor, Plavix, Zestril, Lopressor and as needed nitroglycerin.  No intolerances to high-dose statin medication.  A walking plan was discussed and encouraged for exercise.  Blood pressure was 150 systolic on that visit.  He remained on Zestril and Lopressor.  He was reminded that exercise would be beneficial.  It was recommended that he see his PCP in case further medication titration was needed.  He is here today for 1 year follow-up.  He denies any recent acute illnesses or hospitalizations in the interim since last visit.  He states he has been dealing with some depression due to recent loss of a relative and helping out with a relative with dementia.  Otherwise he he denies any anginal or exertional symptoms, palpitations or arrhythmias, orthostatic symptoms, CVA or TIA-like symptoms, PND, orthopnea, bleeding, claudication-like symptoms, DVT or PE-like symptoms, lower extremity edema.  He continues with Zestril, Lopressor, aspirin, Plavix, Lipitor, Lasix sublingual nitroglycerin.  [facility] [place] [facility] but [facility] not satisfied.  States he plans on [place] in the [place]  Past Medical History:  Diagnosis Date  . CAD (coronary artery disease)    Multivessel status post CABG April 2016, DES  to SVG (Y graft) to OM and SVG to PDA/PLA July 2016  . Essential hypertension   . Hyperlipidemia   . NSTEMI (non-ST elevated myocardial infarction) [facility])    April 2016 and July 2016    Past Surgical History:  Procedure Laterality Date  . CARDIAC CATHETERIZATION N/A 10/03/2014   Procedure: Left Heart Cath and Coronary Angiography;  Surgeon: Lennette Bihari, MD;  Location: [facility] INVASIVE CV LAB;  Service: Cardiovascular;  Laterality: N/A;  . CARDIAC CATHETERIZATION  10/03/2014   Procedure: Coronary Stent Intervention;  Surgeon: Lennette Bihari, MD;  Location: [facility] INVASIVE CV LAB;  Service: Cardiovascular;;  . CARDIAC CATHETERIZATION N/A 10/05/2014   Procedure: Coronary Stent Intervention;  Surgeon: Lennette Bihari, MD;  Location: [facility] INVASIVE CV LAB;  Service: Cardiovascular;  Laterality: N/A;  . CORONARY ARTERY BYPASS GRAFT N/A 07/14/2014   Procedure: CORONARY ARTERY BYPASS GRAFTING (CABG), ON PUMP, TIMES FIVE, USING BILATERAL MAMMARY ARTERIES, RIGHT GREATER SAPHENOUS VEIN HARVESTED ENDOSCOPICALLY;  Surgeon: Loreli Slot, MD;  Location: [facility] OR;  Service: Open Heart Surgery;  Laterality: N/A;  Bilateral Mammary Arteries  . LEFT HEART CATHETERIZATION WITH CORONARY ANGIOGRAM N/A 07/10/2014   Procedure: LEFT HEART CATHETERIZATION WITH CORONARY ANGIOGRAM;  Surgeon: Corky Crafts, MD;  Location: [facility] CATH LAB;  Service: Cardiovascular;  Laterality: N/A;  . Right knee surgery     Trauma  . TEE WITHOUT CARDIOVERSION N/A 07/14/2014   Procedure: TRANSESOPHAGEAL ECHOCARDIOGRAM (TEE);  Surgeon: Loreli Slot, MD;  Location: [facility] OR;  Service: Open Heart Surgery;  Laterality: N/A;    Current  Outpatient Medications  Medication Sig Dispense Refill  . aspirin EC 81 MG tablet Take 81 mg by mouth daily.    Marland Kitchen atorvastatin (LIPITOR) 80 MG tablet TAKE 1 TABLET BY MOUTH ONCE DAILY AT  6PM  (NEED  APPOINTMENT  FOR  FURTHER  REFILLS) 30 tablet 0  . chlorpheniramine-HYDROcodone (TUSSIONEX) 10-8 MG/5ML SUER Take  5 mLs by mouth 2 (two) times daily as needed.    . Cholecalciferol (VITAMIN D-3) 1000 UNITS CAPS Take 1 capsule by mouth daily.    . clopidogrel (PLAVIX) 75 MG tablet Take 1 tablet (75 mg total) by mouth daily. 30 tablet 0  . furosemide (LASIX) 40 MG tablet Take 1 tablet by mouth once daily 30 tablet 0  . lisinopril (ZESTRIL) 20 MG tablet Take 1 tablet (20 mg total) by mouth daily. 90 tablet 1  . metoprolol tartrate (LOPRESSOR) 50 MG tablet Take 1 tablet by mouth twice daily 180 tablet 3  . Multiple Vitamins-Minerals (MULTIVITAMIN ADULT PO) Take 1 tablet by mouth daily.    . nitroGLYCERIN (NITROSTAT) 0.4 MG SL tablet Place 1 tablet (0.4 mg total) under the tongue every 5 (five) minutes x 3 doses as needed for chest pain. 25 tablet 3   No current facility-administered medications for this visit.   Allergies:  Patient has no known allergies.   Social History: The patient  reports that he has quit smoking. His smoking use included cigars. He has never used smokeless tobacco. He reports that he does not drink alcohol and does not use drugs.   Family History: The patient's family history includes Clotting disorder in his father; Coronary artery disease in his mother.   ROS:  Please see the history of present illness. Otherwise, complete review of systems is positive for none.  All other systems are reviewed and negative.   Physical Exam: VS:  BP (!) 148/100   Pulse 75   Resp 16   Ht 6' 3.5" (1.918 m)   Wt (!) 304 lb 9.6 oz (138.2 kg)   SpO2 99%   BMI 37.57 kg/m , BMI Body mass index is 37.57 kg/m.  Wt Readings from Last 3 Encounters:  03/27/20 (!) 304 lb 9.6 oz (138.2 kg)  01/24/19 271 lb (122.9 kg)  01/21/18 268 lb 12.8 oz (121.9 kg)    General: Patient appears comfortable at rest. Neck: Supple, no elevated JVP or carotid bruits, no thyromegaly. Lungs: Clear to auscultation, nonlabored breathing at rest. Cardiac: Regular rate and rhythm, no S3 or significant systolic murmur, no  pericardial rub. Extremities: No pitting edema, distal pulses 2+. Skin: Warm and dry. Musculoskeletal: No kyphosis. Neuropsychiatric: Alert and oriented x3, affect grossly appropriate.  ECG:  An ECG dated 03/27/2018 continue was personally reviewed today and demonstrated:  Sinus rhythm with sinus arrhythmia with occasional PVCs, minimal voltage criteria for LVH.  T wave abnormality consider inferior and anterolateral ischemia.  Heart rate is 75.  Recent Labwork: No results found for requested labs within last 8760 hours.     Component Value Date/Time   CHOL 138 10/04/2014 0408   TRIG 73 10/04/2014 0408   HDL 33 (L) 10/04/2014 0408   CHOLHDL 4.2 10/04/2014 0408   VLDL 15 10/04/2014 0408   LDLCALC 90 10/04/2014 0408    Other Studies Reviewed Today:   Cardiac catheterization 10/03/2014:  Mid LAD lesion, 60% stenosed.  Dist LAD lesion, 60% stenosed.  LIMA was injected is normal in caliber, and is anatomically normal.  There is competitive flow.  RIMA was  injected is normal in caliber, and is anatomically normal.  SVG was injected is normal in caliber.  There is severe focal disease in the graft.  Origin lesion, 80% stenosed.  SVG was injected .  There is severe disease in the graft.  Mid RCA lesion, 30% stenosed.  Dist RCA lesion, 80% stenosed.  Mid Graft to Dist Graft lesion, 100% stenosed. There is a 0% residual stenosis post intervention.  A drug-eluting stent was placed.  The left ventricular systolic function is normal.  Significant 3-vessel native coronary obstructive disease with tandem proximal to mid 60% LAD stenoses with a "flush and fill "phenomena seen in the mid LAD due to competitive LIMA filling; firm percent ostial marginal stenosis with segmental 9095% mid distal marginal branch stenoses of the left circumflex coronary artery; the present mid and 80% distal RCA stenosis proximal to the PDA takeoff with mild competitive filling in the mid  PDA.  Patent LIMA graft supplying the mid LAD.  Y graft consisting of the superior limb being a free RIMA graft supplying the high diagonal/intermediate like vessel and the SVG inferior limb of the Y graft totally occluded in the mid distal segment which previously had supplied the OM 2 vessel.  SVG supplying the PDA vessel with 80% focal proximal SVG stenosis.  Low-normal global LV function with an ejection fraction of 50% and subtle mid anterolateral hypocontractility.  Successful PCI to the SVG Y graft limb with total occlusion of the mid distal portion of the graft, treated successfully with PTCA/DES stenting with a Synergy 3.038 mm stent dilated to 3.15 mm in the 100% occlusion and TIMI 0 flow being improved to 0% stenosis with TIMI-3 flow.    Assessment and Plan:  1. CAD in native artery   2. Mixed hyperlipidemia   3. Essential hypertension    1. CAD in native artery Has any recent anginal or exertional symptoms.  No nitroglycerin use.  Continue aspirin 81 mg daily.  Continue Plavix 75 mg p.o. daily.  Continue sublingual nitroglycerin as needed.  Please refill patient a new bottle of nitroglycerin.  2. Mixed hyperlipidemia Continue atorvastatin 80 mg daily.  Labs followed by [facility] clinic  3. Essential hypertension Blood pressure initially elevated at 148/100.  Recheck in left arm 150/90.  Please increase lisinopril dose to 40 mg daily.  Get a basic metabolic panel and magnesium 2 weeks after initiation of increased dosage.  Advised to check blood pressure at least 3 times per week approximately 2 hours after he takes his medications.  Advised him if he has been doing anything exertional prior to checking his blood pressure to sit for at least 10 minutes before he checked his blood pressure.  Goal is 130/80 or less.  Advised him to call us if sustained blood pressures at or above 130/80.  He verbalizes understanding.  Medication Adjustments/Labs and Tests Ordered: Current  medicines are reviewed at length with the patient today.  Concerns regarding medicines are outlined above.   Disposition: Follow-up with Dr. Domenic Polite or APP 1 year  Signed, Levell July, NP 03/27/2020 1:56 PM    [facility] at [place], [place], [place] [address] Phone: [phone]; Fax: [phone]

## 2020-03-27 ENCOUNTER — Ambulatory Visit (INDEPENDENT_AMBULATORY_CARE_PROVIDER_SITE_OTHER): Payer: Medicare HMO | Admitting: Family Medicine

## 2020-03-27 ENCOUNTER — Encounter: Payer: Self-pay | Admitting: Family Medicine

## 2020-03-27 VITALS — BP 148/100 | HR 75 | Resp 16 | Ht 75.5 in | Wt 304.6 lb

## 2020-03-27 DIAGNOSIS — E782 Mixed hyperlipidemia: Secondary | ICD-10-CM | POA: Diagnosis not present

## 2020-03-27 DIAGNOSIS — I2510 Atherosclerotic heart disease of native coronary artery without angina pectoris: Secondary | ICD-10-CM | POA: Diagnosis not present

## 2020-03-27 DIAGNOSIS — I10 Essential (primary) hypertension: Secondary | ICD-10-CM | POA: Diagnosis not present

## 2020-03-27 MED ORDER — LISINOPRIL 40 MG PO TABS: 40.0000 mg | ORAL_TABLET | Freq: Every day | ORAL | 3 refills | Status: DC

## 2020-03-27 MED ORDER — NITROGLYCERIN 0.4 MG SL SUBL: 0.4000 mg | SUBLINGUAL_TABLET | SUBLINGUAL | 3 refills | Status: AC | PRN

## 2020-03-27 NOTE — Patient Instructions (Addendum)
Medication Instructions:  INCREASE Lisinopril to 40 mg daily  *If you need a refill on your cardiac medications before your next appointment, please call your pharmacy*   Lab Work: BMET,Magnesium in 2 weeks   If you have labs (blood work) drawn today and your tests are completely normal, you will receive your results only by: Marland Kitchen MyChart Message (if you have MyChart) OR . A paper copy in the mail If you have any lab test that is abnormal or we need to change your treatment, we will call you to review the results.   Testing/Procedures: None topday   Follow-Up: At [facility], you and your health needs are our priority.  As part of our continuing mission to provide you with exceptional heart care, we have created designated Provider Care Teams.  These Care Teams include your primary Cardiologist (physician) and Advanced Practice Providers (APPs -  Physician Assistants and Nurse Practitioners) who all work together to provide you with the care you need, when you need it.  We recommend signing up for the patient portal called "MyChart".  Sign up information is provided on this After Visit Summary.  MyChart is used to connect with patients for Virtual Visits (Telemedicine).  Patients are able to view lab/test results, encounter notes, upcoming appointments, etc.  Non-urgent messages can be sent to your provider as well.   To learn more about what you can do with MyChart, go to [email or web address].    Your next appointment:   12 month(s)  The format for your next appointment:   In Person  Provider:   Nena Polio, NP   Other Instructions  None     Thank you for choosing [facility] !

## 2020-04-02 ENCOUNTER — Other Ambulatory Visit: Payer: Self-pay | Admitting: Cardiology

## 2020-04-02 DIAGNOSIS — I10 Essential (primary) hypertension: Secondary | ICD-10-CM

## 2020-04-09 ENCOUNTER — Other Ambulatory Visit: Payer: Self-pay | Admitting: Cardiology

## 2020-04-26 ENCOUNTER — Other Ambulatory Visit: Payer: Self-pay | Admitting: Cardiology

## 2020-05-04 ENCOUNTER — Ambulatory Visit: Payer: Medicare HMO | Admitting: Cardiology

## 2020-11-06 ENCOUNTER — Other Ambulatory Visit: Payer: Self-pay | Admitting: Cardiology

## 2020-11-06 DIAGNOSIS — I10 Essential (primary) hypertension: Secondary | ICD-10-CM

## 2021-02-13 ENCOUNTER — Other Ambulatory Visit: Payer: Self-pay | Admitting: Cardiology

## 2021-05-20 ENCOUNTER — Other Ambulatory Visit: Payer: Self-pay | Admitting: *Deleted

## 2021-05-20 MED ORDER — LISINOPRIL 40 MG PO TABS: 40.0000 mg | ORAL_TABLET | Freq: Every day | ORAL | 0 refills | Status: AC

## 2022-05-26 ENCOUNTER — Telehealth: Payer: Self-pay | Admitting: Nurse Practitioner

## 2022-05-26 NOTE — Telephone Encounter (Signed)
Benjamin King patient regarding recall appointment,  did not wish to keep this appointment at this time.  Deleted recall from system.
